# Patient Record
Sex: Male | Born: 1945 | Race: White | Hispanic: No | Marital: Married | Smoking: Former smoker
Health system: Southern US, Community
[De-identification: ages and names within clinical notes are randomized; demographics above are authoritative.]

## PROBLEM LIST (undated history)

## (undated) DIAGNOSIS — I219 Acute myocardial infarction, unspecified: Secondary | ICD-10-CM

## (undated) DIAGNOSIS — I10 Essential (primary) hypertension: Secondary | ICD-10-CM

## (undated) DIAGNOSIS — I2510 Atherosclerotic heart disease of native coronary artery without angina pectoris: Secondary | ICD-10-CM

## (undated) DIAGNOSIS — K219 Gastro-esophageal reflux disease without esophagitis: Secondary | ICD-10-CM

## (undated) DIAGNOSIS — I714 Abdominal aortic aneurysm, without rupture: Secondary | ICD-10-CM

## (undated) DIAGNOSIS — E785 Hyperlipidemia, unspecified: Secondary | ICD-10-CM

## (undated) DIAGNOSIS — Z9889 Other specified postprocedural states: Secondary | ICD-10-CM

## (undated) DIAGNOSIS — N529 Male erectile dysfunction, unspecified: Secondary | ICD-10-CM

## (undated) DIAGNOSIS — I471 Supraventricular tachycardia, unspecified: Secondary | ICD-10-CM

## (undated) DIAGNOSIS — Z860100 Personal history of colon polyps, unspecified: Secondary | ICD-10-CM

## (undated) DIAGNOSIS — J439 Emphysema, unspecified: Secondary | ICD-10-CM

## (undated) DIAGNOSIS — E669 Obesity, unspecified: Secondary | ICD-10-CM

## (undated) DIAGNOSIS — R112 Nausea with vomiting, unspecified: Secondary | ICD-10-CM

## (undated) HISTORY — DX: Supraventricular tachycardia: I47.1

## (undated) HISTORY — DX: Hyperlipidemia, unspecified: E78.5

## (undated) HISTORY — DX: Gastro-esophageal reflux disease without esophagitis: K21.9

## (undated) HISTORY — DX: Atherosclerotic heart disease of native coronary artery without angina pectoris: I25.10

## (undated) HISTORY — DX: Male erectile dysfunction, unspecified: N52.9

## (undated) HISTORY — PX: COLONOSCOPY: SHX174

## (undated) HISTORY — DX: Obesity, unspecified: E66.9

## (undated) HISTORY — DX: Abdominal aortic aneurysm, without rupture: I71.4

## (undated) HISTORY — DX: Supraventricular tachycardia, unspecified: I47.10

## (undated) HISTORY — DX: Acute myocardial infarction, unspecified: I21.9

## (undated) HISTORY — PX: ERCP: SHX60

## (undated) HISTORY — DX: Essential (primary) hypertension: I10

---

## 2000-06-28 DIAGNOSIS — I219 Acute myocardial infarction, unspecified: Secondary | ICD-10-CM

## 2000-06-28 HISTORY — DX: Acute myocardial infarction, unspecified: I21.9

## 2001-02-26 HISTORY — PX: CORONARY ARTERY BYPASS GRAFT: SHX141

## 2001-03-22 ENCOUNTER — Encounter: Payer: Self-pay | Admitting: Cardiovascular Disease

## 2001-03-22 ENCOUNTER — Encounter: Payer: Self-pay | Admitting: Thoracic Surgery (Cardiothoracic Vascular Surgery)

## 2001-03-22 ENCOUNTER — Inpatient Hospital Stay (HOSPITAL_COMMUNITY): Admission: EM | Admit: 2001-03-22 | Discharge: 2001-03-27 | Payer: Self-pay | Admitting: Emergency Medicine

## 2001-03-22 HISTORY — PX: CARDIAC CATHETERIZATION: SHX172

## 2001-03-23 ENCOUNTER — Encounter: Payer: Self-pay | Admitting: Thoracic Surgery (Cardiothoracic Vascular Surgery)

## 2001-03-24 ENCOUNTER — Encounter: Payer: Self-pay | Admitting: Thoracic Surgery (Cardiothoracic Vascular Surgery)

## 2001-03-25 ENCOUNTER — Encounter: Payer: Self-pay | Admitting: Thoracic Surgery (Cardiothoracic Vascular Surgery)

## 2001-04-23 ENCOUNTER — Emergency Department (HOSPITAL_COMMUNITY): Admission: EM | Admit: 2001-04-23 | Discharge: 2001-04-23 | Payer: Self-pay | Admitting: Emergency Medicine

## 2005-06-28 DIAGNOSIS — I714 Abdominal aortic aneurysm, without rupture, unspecified: Secondary | ICD-10-CM

## 2005-06-28 HISTORY — DX: Abdominal aortic aneurysm, without rupture: I71.4

## 2005-06-28 HISTORY — DX: Abdominal aortic aneurysm, without rupture, unspecified: I71.40

## 2005-10-18 ENCOUNTER — Emergency Department (HOSPITAL_COMMUNITY): Admission: EM | Admit: 2005-10-18 | Discharge: 2005-10-18 | Payer: Self-pay | Admitting: Emergency Medicine

## 2005-10-19 ENCOUNTER — Ambulatory Visit (HOSPITAL_COMMUNITY): Admission: RE | Admit: 2005-10-19 | Discharge: 2005-10-19 | Payer: Self-pay | Admitting: Gastroenterology

## 2005-10-20 ENCOUNTER — Observation Stay (HOSPITAL_COMMUNITY): Admission: EM | Admit: 2005-10-20 | Discharge: 2005-10-21 | Payer: Self-pay | Admitting: Emergency Medicine

## 2005-10-26 HISTORY — PX: US ECHOCARDIOGRAPHY: HXRAD669

## 2007-01-31 HISTORY — PX: CARDIOVASCULAR STRESS TEST: SHX262

## 2007-04-26 ENCOUNTER — Ambulatory Visit: Payer: Self-pay | Admitting: Family Medicine

## 2007-06-05 ENCOUNTER — Ambulatory Visit: Payer: Self-pay | Admitting: Family Medicine

## 2007-07-03 LAB — HM COLONOSCOPY

## 2008-10-11 ENCOUNTER — Ambulatory Visit: Payer: Self-pay | Admitting: Family Medicine

## 2008-11-11 ENCOUNTER — Emergency Department (HOSPITAL_COMMUNITY): Admission: EM | Admit: 2008-11-11 | Discharge: 2008-11-11 | Payer: Self-pay | Admitting: Emergency Medicine

## 2008-11-13 ENCOUNTER — Ambulatory Visit: Payer: Self-pay | Admitting: Family Medicine

## 2009-02-14 ENCOUNTER — Ambulatory Visit: Payer: Self-pay | Admitting: Family Medicine

## 2009-05-16 ENCOUNTER — Encounter: Admission: RE | Admit: 2009-05-16 | Discharge: 2009-05-16 | Payer: Self-pay | Admitting: Family Medicine

## 2010-02-13 ENCOUNTER — Ambulatory Visit: Payer: Self-pay | Admitting: Cardiovascular Disease

## 2010-03-31 ENCOUNTER — Ambulatory Visit: Payer: Self-pay | Admitting: Family Medicine

## 2010-05-01 ENCOUNTER — Ambulatory Visit: Payer: Self-pay | Admitting: Family Medicine

## 2010-10-06 LAB — CBC
Hemoglobin: 16.8 g/dL (ref 13.0–17.0)
MCV: 96 fL (ref 78.0–100.0)
Platelets: 150 10*3/uL (ref 150–400)
RBC: 5.17 MIL/uL (ref 4.22–5.81)

## 2010-10-06 LAB — URINALYSIS, ROUTINE W REFLEX MICROSCOPIC
Glucose, UA: NEGATIVE mg/dL
Protein, ur: NEGATIVE mg/dL
Specific Gravity, Urine: 1.026 (ref 1.005–1.030)
Urobilinogen, UA: 0.2 mg/dL (ref 0.0–1.0)

## 2010-10-06 LAB — DIFFERENTIAL
Basophils Relative: 0 % (ref 0–1)
Eosinophils Relative: 2 % (ref 0–5)
Monocytes Absolute: 0.7 10*3/uL (ref 0.1–1.0)

## 2010-10-06 LAB — COMPREHENSIVE METABOLIC PANEL
AST: 31 U/L (ref 0–37)
Albumin: 3.9 g/dL (ref 3.5–5.2)
Alkaline Phosphatase: 95 U/L (ref 39–117)
CO2: 21 mEq/L (ref 19–32)
Creatinine, Ser: 1.02 mg/dL (ref 0.4–1.5)
GFR calc Af Amer: >60 mL/min (ref >60)
GFR calc non Af Amer: >60 mL/min (ref >60)
Potassium: 4.4 mEq/L (ref 3.5–5.1)

## 2010-11-13 NOTE — Discharge Summary (Signed)
[place]  Patient:    Martin Dickerson, Martin Dickerson Visit Number: [id number] MRN: [id number]          Service Type: MED Location: 2000 2029 01 Attending Physician:  Tressie Stalker Dictated by:   Tollie Pizza. Collins, P.A.-C. Admit Date:  03/22/2001 Disc. Date: 03/27/01   CC:         Alvia Grove., M.D.   Discharge Summary  PRIMARY ADMITTING DIAGNOSES: 1. Acute myocardial infarction. 2. Three-vessel coronary artery disease.  ADDITIONAL DIAGNOSES: 1. History of tobacco abuse. 2. Newly diagnosed hyperlipidemia.  PROCEDURES PERFORMED: 1. Urgent cardiac catheterization. 2. Coronary artery bypass grafting x 4 (left internal mammary artery to the    LAD, saphenous vein graft to second diagonal, right internal mammary artery    to the distal right coronary artery, saphenous vein graft to first    circumflex marginal).  HISTORY:  The patient is a 65 year old white male with no known previous cardiac history.  He does have a significant history of tobacco abuse.  On the date of this admission, he developed sudden onset of severe substernal chest pain.  He was brought via EMS to the emergency department, where he was diagnosed with an acute evolving myocardial infarction.  He was started on IV heparin and nitroglycerin and was stabilized.  He was taken urgently to the cardiac catheterization lab by Dr. Elease Hashimoto and underwent cardiac catheterization, which showed severe three-vessel coronary artery disease and mild left ventricular dysfunction.  It was felt that he would be a good candidate for surgical revascularization.  He was seen in consultation by Dr. Cornelius Moras and was taken to the operating room on September 26.  He underwent a CABG x 4 with the above-noted grafts.  He tolerated the procedure well and was transferred to the SICU in stable condition.  Postoperatively, he has done well.  He was extubated shortly after surgery.  Postop day #1, he  was hemodynamically stable, chest tubes were removed, and he was transferred to 2000.  He has been ambulating in the halls without difficulty.  He has been weaned off supplemental oxygen and his O2 saturations are 91% on room air. His incisions are healing well.  He is tolerating a regular diet and having normal bowel and bladder function.  His labs show a hemoglobin of 9.1 and a hematocrit of 27.  If is felt that if he continues stable, he may be ready for discharge home on March 27, 2001.  DISCHARGE MEDICATIONS: 1. Enteric-coated aspirin 325 mg p.o. q.d. 2. Lopressor 25 mg b.i.d. 3. Altace 2.5 mg q.d. 4. Tylox 1-2 q.4h. p.r.n. for pain.  DISCHARGE INSTRUCTIONS:  He is asked to refrain from driving or lifting anything heavier than 10 pounds.  He should continue a low-fat/low-sodium diet.  He is asked to shower daily and clean his incisions with soap and water.  DISCHARGE FOLLOW-UP:  He will see Dr. Elease Hashimoto in the office in two weeks and have a chest x-ray performed at that time.  He will then follow up the following week with Dr. Cornelius Moras and should bring his chest x-ray to this appointment for Dr. Barry Dienes review.  He is asked to call our office if he experiences any difficulty in the interim. Dictated by:   Tollie Pizza Collins, P.A.-C. Attending Physician:  Tressie Stalker DD:  03/26/01 TD:  03/26/01 Job: [id number]

## 2010-11-13 NOTE — Consult Note (Signed)
NAMERUDOLPHO, CLAXTON NO.:  [id number]   MEDICAL RECORD NO.:  [id number]          PATIENT TYPE:  EMS   LOCATION:  MAJO                         FACILITY:  [facility]   PHYSICIAN:  Janetta Hora. Fields, MD  DATE OF BIRTH:  [birth year]   DATE OF CONSULTATION:  10/18/2005  DATE OF DISCHARGE:                                   CONSULTATION   REQUESTING PHYSICIAN:  Hassan Buckler. Caporossi, MD   REASON FOR CONSULTATION:  Abdominal aortic aneurysm with abdominal pain.   HISTORY OF PRESENT ILLNESS:  The patient is a 65 year old male with a one  day history of abdominal pain in the right upper quadrant.  He also had an  episode of this yesterday, which lasted approximately 45 minutes.  He had  two similar episodes over the past few weeks.  He denies any nausea,  vomiting, diarrhea, fevers or chills.  He denies jaundice.  He denies recent  stool color change.  He denies back pain.  Ultrasound earlier today showed a  5.2 cm abdominal aortic aneurysm with no gallstones or gallbladder wall  thickening; however, a CT angio shows that he has a 3.9 cm abdominal aortic  aneurysm with a common bile duct stone and ductal dilatation.  There is no  evidence of rupture of the aneurysm.   PAST MEDICAL HISTORY:  Coronary artery disease, myocardial infarction,  increased lipids.   PAST SURGICAL HISTORY:  Coronary artery bypass grafting by Dr. Cornelius Moras in 2002.   SOCIAL HISTORY:  Former smoker, quit in 2002.  Occasional alcohol use.   REVIEW OF SYSTEMS:  Occasional dyspnea on exertion.  Denies chest pain,  diabetes, TIA, or musculoskeletal pain.   PHYSICAL EXAMINATION:  VITAL SIGNS:  Blood pressure 180/110, pulse 88,  respirations 16, temperature 98.5.  GENERAL:  He is a white male in no acute distress.  HEENT:  Anicteric sclerae.  NECK:  Carotid pulses 2+ without bruit.  CHEST:  Clear to auscultation.  CARDIAC:  Regular rate and rhythm.  :  ABDOMEN:  Obese, soft, nontender, nondistended.  No  peritoneal signs.  EXTREMITIES:  Femoral and popliteal pulses 2+.  NEUROLOGIC:  He is alert and oriented x3.   LABORATORY:  Hemoglobin 15.9, white blood cell count 8.9, platelet count  144.  Lipase 43, bilirubin 2.5, alkaline phosphatase 114, AST 512, ALT 303.  Creatinine is 1.2.   ASSESSMENT:  Patient with a 3.9 cm abdominal aortic aneurysm without  evidence of rupture.  He also has a common bile duct stone with increased  LFTs and ductal dilatation.   RECOMMENDATIONS:  1.  General surgery consult.  2.  Follow up ultrasound at an appointment with me in one year for repeat      ultrasound of his abdominal aortic aneurysm.  Our office will call to      schedule the patient.           ______________________________  Janetta Hora. Fields, MD     CEF/MEDQ  D:  10/18/2005  T:  10/18/2005  Job:  [id number]

## 2010-11-13 NOTE — Op Note (Signed)
Martin Dickerson, LITKE NO.:  [id number]   MEDICAL RECORD NO.:  [id number]          PATIENT TYPE:  AMB   LOCATION:  ENDO                         FACILITY:  [facility]   PHYSICIAN:  Petra Kuba, M.D.    DATE OF BIRTH:  June 20, 1946   DATE OF PROCEDURE:  10/19/2005  DATE OF DISCHARGE:                                 OPERATIVE REPORT   PROCEDURE:  ERCP sphincterotomy, stone extraction.   INDICATIONS:  CBD stone, both clinically and on CT scan.  Consent was signed  after risks, benefits, methods, options thoroughly discussed in the office  and they saw our ERCP video, both he and his wife.   MEDICINES USED:  Fentanyl 100 mcg, Versed 8 mg.   PROCEDURE:  The side-viewing therapeutic video duodenoscope was inserted by  indirect vision into the stomach.  He had lots of dark fluid which was  easily suctioned. Prior to putting the scope, he did vomit a few times  similar material.  Once we suctioned majority of the fluid lots of gastric  shallow ulcerations were seen including of the greater curve, lesser curve,  antrum and pylorus.  Scope was inserted into the bulb which was scarred and  had some ulcers.  We were able to advance into the second portion of the  duodenum.  Again multiple shallow ulcers and erosions were seen.  The  ampulla which was normal appearing was brought into view.  There was a small  periampullary diverticulum.  Using the triple-lumen sphincterotome, we went  ahead and easily cannulated the papilla on the first injection, a minimal PD  was seen.  The injection was stopped the second that we realized that it was  the PD.  The catheter was readjusted and we were able to get selective  cannulation of the CBD and using the Jag wire, deep selective cannulation  was obtained.  An obvious moderate size stone in the distal duct was  confirmed.  No other abnormalities were seen in the CBD.  At the end of the  procedure using the balloon, we were able to fill the  cystic duct and some  of the gallbladder, but we did not fill it until the end of the procedure.  We only minimally filled the intrahepatic but they were grossly normal on  quick evaluation. We went ahead and proceeded with a moderate-sized  sphincterotomy until we had excellent biliary drainage and able to get the  fully bowed sphincterotome in and out of the duct.  We then exchanged the  sphincterotome for the adjustable balloon.  The stone was not delivered on  the first pull-through which had be deflated to withdraw through but in  readvancing the balloon and inflating only to the 9 mm mark, we had  initially inflated to the 12.  The 9 mm balloon was able to deliver the  stone.  With that there was a little bit of oozing from the sphincterotomy  site.  We went ahead and proceeded with two more balloon pull throughs which  were normal and went much more readily and  easily.  We then proceeded with  an occlusion cholangiogram which did not reveal any additional abnormalities  in this maneuver did reveal the cystic duct being patent and with some  gallbladder filling.  No stones were seen in the gallbladder.  The balloon  was withdrawn one more time without difficulty.  The procedure was  terminated at this junction.  We washed and watched the sphincterotomy site.  The oozing seemed to still be present and we went ahead and advanced the epi  needle.  It seemed to stop at this junction so instead of injecting we just  washed with approximately 4 mL of 1:10,000 of epi into the sphincterotomy  site.  There was no further bleeding.  We did wash and watched the  sphincterotomy site a little longer and could not make it bleed.  There was  some sluggish but present drainage.  We did raise the head of his bed to  confirm some sluggish drainage probably due to spasm or edema.  We elected  to stop the procedure at this junction.  Water and contrast and residual  dark debris was suctioned.  The scope  was withdrawn.  The patient tolerated  the procedure well.  There was no obvious immediate complication.   ENDOSCOPIC DIAGNOSES:  1.  Multiple gastric bulb and duodenal shallow ulcers.  2.  Suctioned a fair amount of fluid from the stomach, both at the beginning      and the end of the procedure.  3.  Periampullary diverticula.  4.  One minimal pancreatic duct injection.  5.  Slightly dilated common bile duct with one single moderately sized stone      and a patent cystic duct and normal intrahepatics which were not      overfilled.  6.  Status post moderate size sphincterotomy.  7.  Stone delivered on the second pull-through using the adjustable balloon.  8.  Two negative balloon pull throughs after that.  9.  Negative occlusion cholangiogram.  10. Sluggish but positive drainage at the end of the procedure.  11. Some oozing from the sphincterotomy which stopped spontaneously with      washing, status post washing with epinephrine only.   PLAN:  Will have to hold aspirin possibly until we rescope him and document  healing.  Will get Dr. Harvie Bridge opinion of how urgently he needs a him back.  In the meantime b.i.d. pump inhibitors for two weeks until we see him back  and then can drop him to once a day.  The warning signs will be discussed.  Will see him back in two weeks unless he has symptoms to repeat liver tests  and discussed questions like possible repeat endoscopy to document healing,  possibly cholecystectomy versus following clinically, although with the  ultrasound CT scan not showing any stones and the ERCP not obviously showing  any residual stones, possibly can hold off.           ______________________________  Petra Kuba, M.D.     MEM/MEDQ  D:  10/19/2005  T:  10/20/2005  Job:  [id number]   cc:   Sharlot Gowda, M.D.  Fax: [phone]   Vesta Mixer, M.D.  Fax: [phone]

## 2010-11-13 NOTE — Cardiovascular Report (Signed)
[place]  Patient:    Martin Dickerson, Martin Dickerson Visit Number: [id number] MRN: [id number]          Service Type: MED Location: 2000 2029 01 Attending Physician:  Tressie Stalker Proc. Date: 03/22/01 Admit Date:  03/22/2001 Discharge Date: 03/27/2001   CC:         Cath Lab   Cardiac Catheterization  INDICATIONS:  Mr. Strey is a middle-aged gentleman with recent onset of chest pain.  He presents to the hospital for further evaluation.  PROCEDURE:  Left heart catheterization with coronary angiography.  DESCRIPTION OF PROCEDURE:  The right femoral artery was easily cannulated using the modified Seldinger technique.  HEMODYNAMICS:  The LV pressure was 151/22 with an aortic pressure of 151/90.  ANGIOGRAPHY:  The left main coronary artery has a 40 to 50% stenosis at its distal segment.  The flow down the left main appears to be well preserved.  The left anterior descending artery is normal in its proximal segment.  It gives off a large diagonal branch.  Following this diagonal branch, there is a 99% stenosis that appears to be a ruptured plaque.  The remainder of the LAD has minor luminal irregularities, but no critical stenosis.  The first diagonal vessel is a fairly large vessel and in fact is about as large as the LAD.  There is a 50% stenosis followed by a 70% stenosis in the proximal aspect of this vessel.  There are several small septal branches coming off the LAD which are unremarkable.  The left circumflex artery is a moderate size vessel.  It is quite tortuous. There is a 50% proximal stenosis.  The first obtuse marginal artery comes off at a fairly acute angle.  There are minor luminal irregularities.  There is a large second obtuse marginal artery which is occluded proximally and fills slowly via left to left collaterals.  The right coronary artery is a large and dominant vessel.  There is a mid 80% stenosis followed by diffuse 50 to 60%  stenosis.  The distal right coronary artery has mild to moderate irregularities.  The posterior descending artery and the posterolateral segment artery are unremarkable.  The left subclavian artery is unremarkable.  There is a 30 to 40% stenosis in the proximal left vertebral artery.  The left internal mammary artery has brisk flow.  The left ventriculogram was performed in the 30 RAO position.  It reveals akinesis/dyskinesis of the apex.  The contractility of the inferior base and anterior base are fairly well preserved.  The overall ejection fraction is mildly to moderately depressed with an ejection fraction of approximately 40%. There is mitral regurgitation.  COMPLICATIONS:  None.  CONCLUSION:  Severe three vessel coronary artery disease.  It appears that his acute lesion is probably in his left anterior descending artery.  He has significant blockages in the other vessels.  He will probably best be served with coronary artery bypass grafting. Attending Physician:  Tressie Stalker DD:  03/29/01 TD:  03/29/01 Job: [id number]

## 2010-11-13 NOTE — Consult Note (Signed)
. Martin Dickerson  Patient:    Martin Dickerson, Martin Dickerson Visit Number: [id number] MRN: [id number]          Service Type: MED Location: 2300 2399 04 Attending Physician:  Koren Bound Dictated by:   Martin Dickerson, M.D. Proc. Date: 03/22/01 Admit Date:  03/22/2001   CC:         Alvia Grove., M.D.  CVTS Office   Consultation Report  REASON FOR CONSULTATION:  Severe three-vessel coronary artery disease status post acute myocardial infarction.  HISTORY OF PRESENT ILLNESS:  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  He developed sudden onset of severe substernal chest pain approximately noon today.  He was brought via EMS to the emergency room where he was diagnosed with acute evolving myocardial infarction.  His chest pain was relieved with administration of intravenous heparin and nitroglycerin.  He was taken to the cardiac catheterization lab promptly by Dr. Elease Hashimoto. Catheterization was notable for severe three-vessel coronary artery disease with mild left ventricular dysfunction.  Cardiac surgical consultation is requested.  REVIEW OF SYSTEMS:  Notable for the following categories and pertinent data: General: The patient has otherwise been well with no significant problems.  He has noted that he becomes progressively more fatigued over the last two to three months.  Otherwise he has been well.  He reports stable weight with no weight gain, weight loss, or other constitutional symptoms.  Cardiac: Notable for chest pain as described above.  He states that he had one similar episode approximately a week ago which was transient and resolved spontaneously.  He also states that he had a particularly severe episode of similar pain approximately three years ago which resolved uneventfully.  He has not sought medical attention for this previously.   He otherwise denies any exertional chest pain.  He reports only mild exertional shortness of breath.  He did get short of breath today with this episode of chest pain occurring at rest.  He denies any problems with PND, orthopnea, or lower extremity edema.  He denies any history of palpitations, syncope, or near syncopal episodes.  Respiratory: Notable for the absence of any symptoms of productive cough, hemoptysis, or wheezing.  Gastrointestinal: Notable only for occasional indigestion.  The patient denies any problems with frequent heartburn.  He has had no problems with diarrhea, constipation, hematochezia, hematemesis, or melena. Neurologic: Essentially negative.  The patient denies any symptoms of TIA or amaurosis fugax.   Musculoskeletal:  Essentially negative.  He does describe chronic mild fatigue and pain in both hands after he has been working strenuously with his hands doing manual tasks.  GU: Negative.  He denies any urinary frequency, hematuria, or dysuria.  Infectious: Negative.  He denies any recent fevers or chills.  Hematologic: Negative.  He denies any history of bleeding diaphysis, frequent epistaxis, or easy bruising.  Endocrine: Negative. Psychiatric: Negative.  Peripheral vascular: Negative.   He specifically denies any symptoms of claudication.  HEENT:  Notable for poor dentition.  He states he has one slightly loose tooth on the left lower molars posteriorly.  PAST MEDICAL HISTORY:  Entirely unremarkable.  The patient denies any known history of hypertension, hypercholesterolemia, or diabetes.  He has no previous cardiac history.  He has been a longstanding smoker.  He smokes approximately one pack of cigarettes per day and has done so for at least 40 years.  He  has had no previous surgical procedures for any reason.  FAMILY HISTORY:  Notable for absence of early onset coronary artery disease. His father did die at the age of 62 of unclear causes.  His mother  suffered a stroke at age 7.  He had one uncle who underwent coronary artery bypass grafting and another uncle who died of coronary artery disease.  SOCIAL HISTORY:  The patient is married and lives with his wife here in [place].  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  His smoking history is as described above.  He denies a history of any significant alcohol consumption.  MEDICATIONS PRIOR TO ADMISSION:  None.  ALLERGIES:  [name] denies any known drug allergies or sensitivities.  PHYSICAL EXAMINATION:  GENERAL:  A well-appearing white male who appears his stated age in no acute distress.  VITAL SIGNS:  He has normal sinus rhythm by telemetry monitor.  Blood pressure approximately 130/70.  He is afebrile.  SKIN:  Clean, dry, and healthy appearing throughout.  HEENT:  Essentially within normal limits although notable for poor dentition. He has no obvious periodontal abscess or other active infections.  NECK:  Supple.  Theree is no cervical or supraclavicular lymphadenopathy. Ther is no jugular venous distension.  No carotid bruits are noted.  CHEST:  Auscultation of the chest reveals clear and symmetrical breath sounds bilaterally.  No wheezes or rhonchi are noted.  CARDIOVASCULAR:  Regular rate and rhythm.  No murmurs, rubs, or gallops are noted.  ABDOMEN:  Flat, soft, and nontender.  There are no palpable masses.  The liver edge is not enlarged.  EXTREMITIES:  Warm and well perfused.  There is no lower extremity edema.  The patient has strong and symmetrical pulses in both lower legs at the ankles. There is no venous insufficiency.  RECTAL/GU:  Exams deferred.  NEUROLOGIC:  Grossly nonfocal and symmetrical throughout.  The remainder of his physical exam is noncontributory.  DIAGNOSTIC TESTS:  Cardiac catheterization performed today by Dr. Elease Hashimoto is  reviewed.  This demonstrates 30 to 50% stenosis of the distal left  main coronary artery.  There is 80 to 90% stenosis of the mid left anterior descending coronary artery arising beyond takeoff of a large second diagonal branch,.  There is 70% proximal stenosis of the second diagonal branch.  There is 100% occlusion of a large circumflex marginal branch.  This fills via left-to-left collaterals.  There is right-dominant coronary circulation. There is 80% stenosis of the mid portion of the right coronary artery.  Left ventricular function is mildly reduced.  Ejection fraction is estimated at 45 to 50%.  There is moderate anteroapical hypokinesis.  There is no mitral regurgitation.  There is 60 to 70% ostial stenosis of the left vertebral artery.  I do not appreciate any significant stenosis of the left subclavian artery, and the left internal mammary artery appears to fill normally.  No other abnormalities are noted.  IMPRESSION:  Severe three-vessel coronary artery disease status post acute non-Q-wave myocardial infarction.  PLAN:  We tentatively plan to proceed with coronary artery bypass grafting first case tomorrow.  I have outlined at length the indications, risks, and potential benefits of coronary artery bypass grafting with Mr. Mitcham and his wife.  All of their questions have been addressed.  They understand and accept all associated risks of surgery including but not limited to risk of death, stroke, myocardial infarction, bleeding requiring blood transfusion, arrhythmia, infection, and recurrent coronary artery disease.  We also discussed the importance that he find a way to quit smoking. Dictated by:   Martin Dickerson, M.D. Attending Physician:  Koren Bound DD:  03/22/01 TD:  03/23/01 Job: [id number]

## 2010-11-13 NOTE — Discharge Summary (Signed)
NAMEJT, Martin Dickerson NO.:  [id number]   MEDICAL RECORD NO.:  [id number]          PATIENT TYPE:  OBV   LOCATION:  4734                         FACILITY:  [facility]   PHYSICIAN:  Vesta Mixer, M.D. DATE OF BIRTH:  1945/07/07   DATE OF ADMISSION:  10/20/2005  DATE OF DISCHARGE:  10/21/2005                                 DISCHARGE SUMMARY   DISCHARGE DIAGNOSES:  1.  Supraventricular tachycardia.  2.  History of coronary artery disease.  3.  Hyperlipidemia.  4.  History of abdominal aortic aneurysm.  5.  History of gallstones, status post endoscopic retrograde      cholangiopancreatogram.   DISCHARGE MEDICATIONS:  1.  Enteric-coated aspirin 81 mg a day.  2.  Vytorin 10 mg/40 mg once a day.  3.  Metoprolol 25 mg p.o. b.i.d.  4.  Zantac once a day.  5.  Nitroglycerin 0.4 mg sublingually as needed.   DISPOSITION:  The patient will see Dr. Elease Hashimoto in the office in one to two  weeks.   HISTORY:  Martin Dickerson is a 65 year old gentleman who was recently admitted for  ERCP for a gallstone.  He tolerated the procedure quite well.  He was off  his Lopressor for the past three days.  He returned last night with tachy  palpitations and some chest discomfort.  Please see dictated H&P for further  details.   HOSPITAL COURSE:  SVT.  The patient's SVT resolved.  The SVT resolved after  20 mg of IV Cardizem and converted to normal sinus rhythm.  The patient's  chest pain and tachy palpitations resolved.  He ruled out for myocardial  infarction.   The patient did well and feels quite well this morning.  We will see him in  the office for an echocardiogram, as well as a stress test.  He has a  history of coronary artery bypass grafting in 2002.  Will continue with his  other medications including his Lopressor.  I have asked him to call me  sooner if he has any problems.           ______________________________  Vesta Mixer, M.D.     PJN/MEDQ  D:  10/21/2005  T:   10/21/2005  Job:  [id number]   cc:   Cassell Clement, M.D.  Fax: [phone]   Janetta Hora. Fields, MD  [address], [place] [address]   Petra Kuba, M.D.  Fax: [phone]

## 2010-11-13 NOTE — H&P (Signed)
Martin Dickerson, SENTELL NO.:  [id number]   MEDICAL RECORD NO.:  [id number]          PATIENT TYPE:  INP   LOCATION:  1824                         FACILITY:  [facility]   PHYSICIAN:  Cassell Clement, M.D. DATE OF BIRTH:  [birth year]   DATE OF ADMISSION:  10/20/2005  DATE OF DISCHARGE:                                HISTORY & PHYSICAL   CHIEF COMPLAINT:  Chest pain.   HISTORY:  This is a 65 year old, married, Caucasian gentleman admitted  through the emergency room after experiencing chest pain and rapid  supraventricular tachycardia at a rate of 210 per minute.  The patient had  been seen here in the emergency room two days ago, on October 18, 2005, with  right upper quadrant pain, was found to have a common duct stone.  He was  also found to have an asymptomatic abdominal aortic aneurysm.  He was seen  by Dr. Fabienne Bruns who felt that the aneurysm could be followed as an  outpatient in his office.  He returned yesterday, October 19, 2005, and  underwent successful ERCP of a common duct stone by Dr. Ewing Schlein.  The patient  was also noted at the time of the endoscopy to have some superficial gastric  erosions and was therefore advised not to resume his aspirin for about two  weeks.  The patient has a history of coronary disease.  He normally has been  on Metoprolol 50 mg a half a tablet b.i.d., Ecotrin 81 mg daily, and Vytorin  10/40 once a day.  Because of his GI symptoms, on October 18, 2005, he did not  take any Metoprolol after his morning dose on October 18, 2005.  He took none  on October 19, 2005 or today.  Today at home, he awoke from a nap with severe  substernal chest pain radiating into both arms associated with diaphoresis  and nausea.  He took a single nitroglycerin with no help and at that point  called EMS.  When they arrived, they found that he was in a rapid narrow  complex supraventricular tachycardia at 210 per minute.  They gave him a  slow bolus of 20 mg of IV  Cardizem and he broke into sinus rhythm and at the  same time his chest pain resolved.   PAST MEDICAL HISTORY:  He underwent cardiac catheterization in 2002, by Dr.  Kristeen Miss, who found severe three-vessel coronary artery disease and he  was referred to Dr. Tressie Stalker who undertook coronary artery bypass graft  surgery.  The patient has done well since his bypass.  He has not had any  followup stress testing, and he has had no angina.   FAMILY HISTORY:  Revealed that his half brother has had coronary disease and  has had stents.  The patient has lots of uncles and cousins with coronary  disease.   SOCIAL HISTORY:  Reveals that he is married.  He has two children by a  previous marriage.  He works as a Higher education careers adviser at [employer].  He quit smoking, in 2002, at  the time of his CABG.  He does not  drink alcohol.   He has no known drug allergies.   He has had no operations except for his coronary bypass surgery.   REVIEW OF SYSTEMS:  The patient has not been experiencing any exertional  chest pain.  He does have a recently discovered abdominal aortic aneurysm,  and he will be followed up by Dr. Fabienne Bruns of [facility].  Gastrointestinal  history reveals that he does not have any history of prior known gallstones  and has had chronic indigestion which has not recurred since he had his  procedure yesterday.  GENITOURINARY:  Reveals no symptoms.  RESPIRATORY:  Reveals no cough or sputum production.  ENDOCRINE:  Reveals no diabetes  history.   PHYSICAL EXAMINATION:  VITAL SIGNS:  Blood pressure 112/62, pulse 95,  respirations normal.  HEENT:  Negative.  CHEST:  Clear.  HEART:  Reveals no murmur, gallop, or rub.  ABDOMEN:  Soft and nontender.  EXTREMITIES:  Show no edema or phlebitis.   His electrocardiogram #1, taken at home, shows SVT at 210 per minute with ST  segment depression in II and in V4-V6.  The second electrocardiogram, taken  here, shows normal  sinus rhythm, minor nonspecific T wave flattening and  left atrial abnormality.  The chest x-ray shows normal heart size,  postoperative changes from his bypass surgery, and minimal bibasilar  atelectasis.   Initial labs on I-STAT include a hemoglobin of 15, white count of 11,000.  Potassium 3.7, sodium 134, BUN 13, creatinine 1.1, blood sugar 99.  His  initial cardiac enzymes are negative.   IMPRESSION:  1.  Supraventricular tachycardia, now resolved.  The supraventricular      tachycardia may have been precipitated by the abrupt stopping of his      beta-blocker.  2.  Chest pain consistent with acute coronary syndrome brought on by marked      tachycardia, now resolved.  3.  Recent endoscopic retrograde cholangiopancreatography yesterday for      common duct stone.  He still has some visual jaundice.  4.  Superficial gastric erosions.  5.  Asymptomatic abdominal aortic aneurysm.  6.  Status post coronary artery bypass graft, 2002.  7.  Hyperlipidemia.   DISPOSITION:  1.  We are admitting to observation status to Dr. Elease Hashimoto on telemetry.  2.  We will get serial enzymes and EKGs.  3.  We will resume his metoprolol now.  4.  We will not restart his aspirin yet in view of his recent gastric      erosions.  5.  If he rules out for myocardial infarction, he may possibly be discharged      tomorrow with followup with Dr. Elease Hashimoto for outpatient evaluation      including possible Cardiolite stress test.  He should also at some point      probably have an echocardiogram in view of his left atrial abnormality      on EKG.           ______________________________  Cassell Clement, M.D.     TB/MEDQ  D:  10/20/2005  T:  10/20/2005  Job:  [id number]   cc:   Vesta Mixer, M.D.  Fax: [phone]   Petra Kuba, M.D.  Fax: [phone]   Janetta Hora. Fields, MD  [address], [place] [address]

## 2010-11-13 NOTE — Op Note (Signed)
[place]  Patient:    Martin Dickerson, Martin Dickerson Visit Number: [id number] MRN: [id number]          Service Type: MED Location: 2300 2311 01 Attending Physician:  Tressie Stalker Dictated by:   Salvatore Decent. Cornelius Moras, M.D. Proc. Date: 03/23/01 Admit Date:  03/22/2001   CC:         Alvia Grove., M.D.   Operative Report  PREOPERATIVE DIAGNOSIS:  Severe three-vessel coronary artery disease, status post acute non-Q-wave myocardial infarction.  POSTOPERATIVE DIAGNOSIS:  Severe three-vessel coronary artery disease, status post acute non-Q-wave myocardial infarction.  PROCEDURES:  Median sternotomy for coronary artery bypass grafting x 4 (left internal mammary artery to distal left anterior descending coronary artery, right internal mammary artery to distal right coronary artery, saphenous vein graft to second diagonal branch, saphenous vein graft to first circumflex marginal branch).  SURGEON:  Salvatore Decent. Cornelius Moras, M.D.  ASSISTANT:  Dominica Severin, P.A.  ANESTHESIA:  General.  BRIEF CLINICAL NOTE:  Patient is a 65 year old white male with no previous cardiac history and long-standing history of tobacco abuse.  He was admitted on March 22, 2001, with an acute non-Q-wave myocardial infarction.  He was taken to the cardiac catheterization lab by Dr. Elease Hashimoto, where coronary arteriography revealed severe three-vessel coronary artery disease with mild left ventricular dysfunction.  A full consultation note has been dictated previously.  The patient and his wife have been counseled at length regarding the indications, risks, and potential benefits of coronary artery bypass grafting.  They desire to proceed with surgery as described.  DESCRIPTION OF PROCEDURE:  The patient is brought to the operating room on the above-mentioned date, and invasive hemodynamic monitoring is established by the anesthesia service under the care and direction of Maren Beach,  M.D. The patient is placed in the supine position on the operating table. Following the induction of general endotracheal anesthesia, the patients chest, abdomen, both lower extremities, and both groins are prepared and draped in a sterile manner.  Intravenous antibiotics are administered.  A median sternotomy incision is performed, and the left internal mammary artery is dissected from the chest wall and prepared for bypass grafting.  The left internal mammary artery is felt to be good-quality conduit.  Subsequently the right internal mammary artery is dissected from the chest wall and also prepared for bypass grafting.  The right internal mammary artery is felt to be good-quality conduit.  Simultaneously saphenous vein is obtained from the patients right lower leg through a longitudinal incision.  The saphenous vein is felt to be good-quality conduit.  The patient is heparinized systemically.  The pericardium is opened.  The ascending aorta is normal in appearance.  The ascending aorta and the right atrium are cannulated for cardiopulmonary bypass.  Adequate heparinization is verified.  Cardiopulmonary bypass is begun, and the surface of the heart is inspected. Distal sites are selected for coronary bypass grafting.  Portions of saphenous vein and the left internal mammary artery and right internal mammary artery are all trimmed to appropriate length.  A temperature probe is placed in the left ventricular septum and a Styrofoam pad is placed to protect the left phrenic nerve from thermal injury.  A cardioplegia catheter is placed in the ascending aorta.  The patient is cooled to 32 degrees systemic temperature.  The aortic crossclamp is applied, and cardioplegia is delivered in antegrade fashion through the aortic root.  Iced saline slush is applied for topical hypothermia.  The initial cardioplegic  arrest and myocardial cooling are felt to be excellent.  Repeat doses of cardioplegia  are administered intermittently throughout the crossclamp portion of the operation both through the aortic root and down subsequently-placed vein grafts to maintain septal temperatures below 15 degrees Centigrade.   The following distal coronary anastomoses are performed:  (1) The first circumflex marginal branch is grafted with a saphenous vein graft in an end-to-side fashion.  This coronary measures 1.7 mm in diameter and is of good quality.  (2) The second diagonal branch off the left anterior descending coronary artery is grafted with a saphenous vein graft in an end-to-side fashion.  This coronary measures 1.7 mm in diameter and is of good quality.  (3) The distal right coronary artery is grafted with the right internal mammary artery in an end-to-side fashion.  This coronary measures 1.8 mm in diameter and is of fair to good quality.  (4) The distal left anterior descending coronary artery is grafted with the left internal mammary artery artery in an end-to-side fashion.  This coronary measures 1.7 mm in diameter and is of good quality.  Both proximal saphenous vein anastomoses are performed directly to the ascending aorta prior to removal of the aortic crossclamp.  The septal temperature is noted to rise rapidly and dramatically upon reperfusion of the internal mammary arteries.  The heart begins to beat spontaneously.  The aortic crossclamp is removed after a total crossclamp time of 62 minutes.  Normal sinus rhythm resumes spontaneously.  All proximal and distal anastomoses are inspected for hemostasis and appropriate graft orientation. The patient is rewarmed to greater than 37 degrees Centigrade temperature. The patient is weaned from cardiopulmonary bypass without difficulty.  The patients rhythm at separation from bypass is normal sinus rhythm.  No inotropic support is required.  Total cardiopulmonary bypass time for the operation is 95 minutes.  The mediastinum and both  left and right pleural spaces are irrigated with  saline solution containing vancomycin.  Meticulous surgical hemostasis is ascertained.  The mediastinum and both left and right pleural spaces are drained with four chest tubes placed in separate stab incisions inferiorly. The median sternotomy is closed in routine fashion.  The right lower extremity incision is closed in multiple layers in routine fashion.  All skin incisions are closed with subcuticular skin closures.  The patient tolerated the procedure well and is transported to the surgical intensive care unit in stable condition.  There are no intraoperative complications.  All sponge, instrument, and needle counts are verified correct at completion of the operation.  No blood products were administered.Dictated by:   Salvatore Decent Cornelius Moras, M.D. Attending Physician:  Tressie Stalker DD:  03/23/01 TD:  03/23/01 Job: [id number]

## 2011-03-04 ENCOUNTER — Other Ambulatory Visit: Payer: Self-pay | Admitting: Cardiovascular Disease

## 2011-03-05 ENCOUNTER — Other Ambulatory Visit: Payer: Self-pay | Admitting: Cardiovascular Disease

## 2011-03-05 DIAGNOSIS — E7800 Pure hypercholesterolemia, unspecified: Secondary | ICD-10-CM

## 2011-03-05 NOTE — Telephone Encounter (Signed)
Needs yearly app, gave 30 pills will refill more once app made.

## 2011-03-19 ENCOUNTER — Encounter: Payer: Self-pay | Admitting: Family Medicine

## 2011-03-22 ENCOUNTER — Encounter: Payer: Self-pay | Admitting: Family Medicine

## 2011-03-22 ENCOUNTER — Ambulatory Visit (INDEPENDENT_AMBULATORY_CARE_PROVIDER_SITE_OTHER): Payer: Medicare HMO | Admitting: Family Medicine

## 2011-03-22 VITALS — BP 124/82 | HR 70 | Ht 72.0 in | Wt 246.0 lb

## 2011-03-22 DIAGNOSIS — I10 Essential (primary) hypertension: Secondary | ICD-10-CM | POA: Insufficient documentation

## 2011-03-22 DIAGNOSIS — I714 Abdominal aortic aneurysm, without rupture, unspecified: Secondary | ICD-10-CM

## 2011-03-22 DIAGNOSIS — Z23 Encounter for immunization: Secondary | ICD-10-CM

## 2011-03-22 DIAGNOSIS — N529 Male erectile dysfunction, unspecified: Secondary | ICD-10-CM

## 2011-03-22 DIAGNOSIS — I2510 Atherosclerotic heart disease of native coronary artery without angina pectoris: Secondary | ICD-10-CM

## 2011-03-22 DIAGNOSIS — E785 Hyperlipidemia, unspecified: Secondary | ICD-10-CM | POA: Insufficient documentation

## 2011-03-22 DIAGNOSIS — K219 Gastro-esophageal reflux disease without esophagitis: Secondary | ICD-10-CM | POA: Insufficient documentation

## 2011-03-22 LAB — LIPID PANEL
HDL: 45 mg/dL (ref >39)
Triglycerides: 144 mg/dL (ref <150)

## 2011-03-22 LAB — CBC WITH DIFFERENTIAL/PLATELET
HCT: 48.5 % (ref 39.0–52.0)
Lymphs Abs: 2.6 10*3/uL (ref 0.7–4.0)
MCHC: 33.8 g/dL (ref 30.0–36.0)
Monocytes Absolute: 0.6 10*3/uL (ref 0.1–1.0)
Monocytes Relative: 8 % (ref 3–12)
Neutrophils Relative %: 51 % (ref 43–77)
Platelets: 180 10*3/uL (ref 150–400)
RBC: 5.07 MIL/uL (ref 4.22–5.81)
RDW: 14.2 % (ref 11.5–15.5)

## 2011-03-22 LAB — COMPREHENSIVE METABOLIC PANEL
Albumin: 4.4 g/dL (ref 3.5–5.2)
CO2: 27 mEq/L (ref 19–32)
Chloride: 101 mEq/L (ref 96–112)
Creat: 1.11 mg/dL (ref 0.50–1.35)
Glucose, Bld: 91 mg/dL (ref 70–99)
Potassium: 4.7 mEq/L (ref 3.5–5.3)
Sodium: 137 mEq/L (ref 135–145)

## 2011-03-22 MED ORDER — NITROGLYCERIN 0.4 MG SL SUBL: 0.4000 mg | SUBLINGUAL_TABLET | SUBLINGUAL | Status: DC | PRN

## 2011-03-22 MED ORDER — TADALAFIL 20 MG PO TABS: 20.0000 mg | ORAL_TABLET | Freq: Every day | ORAL | Status: DC | PRN

## 2011-03-22 MED ORDER — METOPROLOL TARTRATE 25 MG PO TABS: 25.0000 mg | ORAL_TABLET | Freq: Two times a day (BID) | ORAL | Status: DC

## 2011-03-22 MED ORDER — RANITIDINE HCL 150 MG PO TABS: 150.0000 mg | ORAL_TABLET | Freq: Every day | ORAL | Status: DC

## 2011-03-22 MED ORDER — ROSUVASTATIN CALCIUM 20 MG PO TABS: ORAL_TABLET | ORAL | Status: DC

## 2011-03-22 MED ORDER — HYDROCHLOROTHIAZIDE 12.5 MG PO CAPS: 12.5000 mg | ORAL_CAPSULE | Freq: Every day | ORAL | Status: DC

## 2011-03-22 NOTE — Patient Instructions (Addendum)
Do not use the nitroglycerin when you take the Cialis. Take the acid reducer every other day and potentially every third day a stone your symptoms. Continue on your other medications.

## 2011-03-22 NOTE — Progress Notes (Signed)
Subjective:    Patient ID: Martin Dickerson, male    DOB: Nov 07, 1945, 65 y.o.   MRN: [id number]  HPI He is here for complete examination. He has no particular concerns or complaints. He has had no difficulty with chest pain, shortness of breath, DOE. He continues on medications listed in the chart. His reflux is under good control. He still has some erectile dysfunction but has not gotten the Cialis renewed due to cost. His history was reviewed and is in the record   Review of Systems  Constitutional: Negative.   HENT: Negative.   Eyes: Negative.   Respiratory: Negative.   Cardiovascular: Negative.   Gastrointestinal: Negative.   Genitourinary: Negative.   Musculoskeletal: Negative.   Skin: Negative.   Neurological: Negative.   Hematological: Negative.   Psychiatric/Behavioral: Negative.        Objective:   Physical Exam BP 124/82  Pulse 70  Ht 6' (1.829 m)  Wt 246 lb (111.585 kg)  BMI 33.36 kg/m2  General Appearance:    Alert, cooperative, no distress, appears stated age  Head:    Normocephalic, without obvious abnormality, atraumatic  Eyes:    PERRL, conjunctiva/corneas clear, EOM's intact, fundi    benign  Ears:    Normal TM's and external ear canals  Nose:   Nares normal, mucosa normal, no drainage or sinus   tenderness  Throat:   Lips, mucosa, and tongue normal; teeth and gums normal  Neck:   Supple, no lymphadenopathy;  thyroid:  no   enlargement/tenderness/nodules; no carotid   bruit or JVD  Back:    Spine nontender, no curvature, ROM normal, no CVA     tenderness  Lungs:     Clear to auscultation bilaterally without wheezes, rales or     ronchi; respirations unlabored  Chest Wall:    No tenderness or deformity   Heart:    Regular rate and rhythm, S1 and S2 normal, no murmur, rub   or gallop  Breast Exam:    No chest wall tenderness, masses or gynecomastia  Abdomen:     Soft, non-tender, nondistended, normoactive bowel sounds,    no masses, no hepatosplenomegaly    Genitalia:    Normal male external genitalia without lesions.  Testicles without masses.  No inguinal hernias.  Rectal:    Normal sphincter tone, no masses or tenderness; guaiac negative stool.  Prostate smooth, no nodules, not enlarged.  Extremities:   No clubbing, cyanosis or edema  Pulses:   2+ and symmetric all extremities  Skin:   Skin color, texture, turgor normal, no rashes or lesions.scaly slightly red lesion noted on right cheek   Lymph nodes:   Cervical, supraclavicular, and axillary nodes normal  Neurologic:   CNII-XII intact, normal strength, sensation and gait; reflexes 2+ and symmetric throughout          Psych:   Normal mood, affect, hygiene and grooming.    EKG shows no acute changes       Assessment & Plan:   1. AAA (abdominal aortic aneurysm)  US Aorta Initial Medicare Screen, PR ELECTROCARDIOGRAM, COMPLETE, CBC with Differential, Comprehensive metabolic panel, Lipid Profile, Flu vaccine greater than or equal to 3yo with preservative IM, POCT Occult Blood Stool  2. ASHD (arteriosclerotic heart disease)    3. Hypertension    4. Hyperlipidemia LDL goal <70    5. ED (erectile dysfunction)    6. GERD (gastroesophageal reflux disease)    7. Actinic keratosis: Discussed the therapy for this. He  will wait before before referral Patient did not want PSA I will renew his medications including Cialis.

## 2011-03-23 ENCOUNTER — Telehealth: Payer: Self-pay

## 2011-03-23 ENCOUNTER — Other Ambulatory Visit: Payer: Self-pay

## 2011-03-23 ENCOUNTER — Ambulatory Visit (HOSPITAL_COMMUNITY)
Admission: RE | Admit: 2011-03-23 | Discharge: 2011-03-23 | Disposition: A | Discharge status: Home / Self Care | Payer: Medicare HMO | Source: Ambulatory Visit | Attending: Family Medicine | Admitting: Family Medicine

## 2011-03-23 DIAGNOSIS — I714 Abdominal aortic aneurysm, without rupture, unspecified: Secondary | ICD-10-CM | POA: Insufficient documentation

## 2011-03-23 MED ORDER — METOPROLOL TARTRATE 25 MG PO TABS: 25.0000 mg | ORAL_TABLET | Freq: Two times a day (BID) | ORAL | Status: DC

## 2011-03-23 NOTE — Telephone Encounter (Signed)
Called pt to let him know that lipids not good to take full dose of crestor pt aware

## 2011-03-23 NOTE — Telephone Encounter (Signed)
The u/s needs to repeated in 6 to 12 mth

## 2011-03-24 ENCOUNTER — Telehealth: Payer: Self-pay | Admitting: Family Medicine

## 2011-03-24 MED ORDER — NITROGLYCERIN 0.4 MG SL SUBL: 0.4000 mg | SUBLINGUAL_TABLET | SUBLINGUAL | Status: DC | PRN

## 2011-03-24 MED ORDER — ROSUVASTATIN CALCIUM 20 MG PO TABS: ORAL_TABLET | ORAL | Status: DC

## 2011-03-24 MED ORDER — RANITIDINE HCL 150 MG PO TABS: 150.0000 mg | ORAL_TABLET | Freq: Every day | ORAL | Status: DC

## 2011-03-24 NOTE — Telephone Encounter (Signed)
Done

## 2011-04-22 ENCOUNTER — Encounter: Payer: Self-pay | Admitting: Cardiovascular Disease

## 2011-04-26 ENCOUNTER — Telehealth: Payer: Self-pay | Admitting: *Deleted

## 2011-04-26 NOTE — Telephone Encounter (Signed)
Wife called to cancel Wed. appt with Dr. Elease Hashimoto and lab.  Pt will call back at a later time to reschedule. Mylo Red RN

## 2011-04-28 ENCOUNTER — Ambulatory Visit: Payer: Self-pay | Admitting: Cardiovascular Disease

## 2011-04-28 ENCOUNTER — Other Ambulatory Visit: Payer: Self-pay | Admitting: *Deleted

## 2011-05-14 ENCOUNTER — Telehealth: Payer: Self-pay | Admitting: Family Medicine

## 2011-05-14 MED ORDER — ATORVASTATIN CALCIUM 20 MG PO TABS: 20.0000 mg | ORAL_TABLET | Freq: Every day | ORAL | Status: DC

## 2011-05-14 NOTE — Telephone Encounter (Signed)
Check with the patient and find out if he questioned switching from his Crestor to Lipitor or whether the pharmacy did this and let me know

## 2011-05-14 NOTE — Telephone Encounter (Signed)
Cancelled crestor and added lipitor with 1 refill

## 2011-05-14 NOTE — Telephone Encounter (Signed)
LABS MAILED

## 2011-05-14 NOTE — Telephone Encounter (Signed)
Pharmacy called pt yesterday and asked if he wanted to switch because it was much cheaper and he said yes, so he they wanted to know if it was okay to switch him

## 2011-05-14 NOTE — Telephone Encounter (Signed)
Fax req recd for Atorvastatin #90 in lieu of Crestor.

## 2011-05-14 NOTE — Telephone Encounter (Signed)
Switch him to the 20 mg Lipitor

## 2011-10-05 ENCOUNTER — Telehealth: Payer: Self-pay | Admitting: Internal Medicine

## 2011-10-05 MED ORDER — HYDROCHLOROTHIAZIDE 12.5 MG PO CAPS: 12.5000 mg | ORAL_CAPSULE | Freq: Every day | ORAL | Status: DC

## 2011-10-05 MED ORDER — METOPROLOL TARTRATE 25 MG PO TABS: 25.0000 mg | ORAL_TABLET | Freq: Two times a day (BID) | ORAL | Status: DC

## 2011-10-05 NOTE — Telephone Encounter (Signed)
Med sent in pt needs refill

## 2011-10-05 NOTE — Telephone Encounter (Signed)
Sent 90 days of med in pt needs office visit

## 2011-10-11 ENCOUNTER — Other Ambulatory Visit: Payer: Self-pay

## 2011-10-21 ENCOUNTER — Other Ambulatory Visit: Payer: Self-pay | Admitting: Family Medicine

## 2011-10-21 MED ORDER — METOPROLOL TARTRATE 25 MG PO TABS: 25.0000 mg | ORAL_TABLET | Freq: Two times a day (BID) | ORAL | Status: DC

## 2011-10-21 MED ORDER — HYDROCHLOROTHIAZIDE 12.5 MG PO CAPS: 12.5000 mg | ORAL_CAPSULE | Freq: Every day | ORAL | Status: DC

## 2011-10-21 NOTE — Telephone Encounter (Signed)
Not out right now but on last refill

## 2011-12-21 ENCOUNTER — Other Ambulatory Visit: Payer: Self-pay | Admitting: Family Medicine

## 2012-01-17 ENCOUNTER — Other Ambulatory Visit: Payer: Self-pay | Admitting: Family Medicine

## 2012-03-13 ENCOUNTER — Ambulatory Visit (INDEPENDENT_AMBULATORY_CARE_PROVIDER_SITE_OTHER): Payer: Medicare HMO | Admitting: Family Medicine

## 2012-03-13 ENCOUNTER — Encounter: Payer: Self-pay | Admitting: Family Medicine

## 2012-03-13 VITALS — BP 128/80 | HR 68 | Wt 242.0 lb

## 2012-03-13 DIAGNOSIS — Z23 Encounter for immunization: Secondary | ICD-10-CM

## 2012-03-13 DIAGNOSIS — I951 Orthostatic hypotension: Secondary | ICD-10-CM

## 2012-03-13 MED ORDER — INFLUENZA VIRUS VACC SPLIT PF IM SUSP: 0.5000 mL | Freq: Once | INTRAMUSCULAR | Status: DC

## 2012-03-13 NOTE — Progress Notes (Signed)
  Subjective:    Patient ID: Martin Dickerson, male    DOB: [birth year], 66 y.o.   MRN: [id number]  HPI Last Thursday morning when he got out of bed he noted dizziness. He was able to walk to the bathroom. He continued to have dizziness that lasted for several minutes. We went back to bed the dizziness went away. He did note during the rest of the day that when he would get up from sitting that he would be dizzy very slightly. He has not had any more of these episodes. He noted no numbness, tingling, heart rate changes. Had no changes in his medications the   Review of Systems     Objective:   Physical Exam alert and in no distress. EOMI. Other cranial nerves intact. No carotid bruits. Tympanic membranes and canals are normal. Throat is clear. Tonsils are normal. Neck is supple without adenopathy or thyromegaly. Cardiac exam shows a regular sinus rhythm without murmurs or gallops. Lungs are clear to auscultation.        Assessment & Plan:   1. Postural hypotension    encouraged him to be vigilant. Recommend that he go from lying to sitting to standing to walking a little more slowly. If he has further difficulties, he is to return here. Also the geriatric flu shot was given. Benefits and risks were discussed.

## 2012-03-13 NOTE — Patient Instructions (Signed)
Go slowly from lying to sitting to standing to moving and if you do get a little bit dizzy stop,until you get your bearings back

## 2012-04-19 ENCOUNTER — Telehealth: Payer: Self-pay | Admitting: Internal Medicine

## 2012-04-19 MED ORDER — METOPROLOL TARTRATE 25 MG PO TABS: 25.0000 mg | ORAL_TABLET | Freq: Two times a day (BID) | ORAL | Status: DC

## 2012-04-19 MED ORDER — RANITIDINE HCL 150 MG PO TABS: 150.0000 mg | ORAL_TABLET | Freq: Every day | ORAL | Status: DC

## 2012-04-19 NOTE — Telephone Encounter (Signed)
Med sent in.

## 2012-06-13 ENCOUNTER — Other Ambulatory Visit: Payer: Self-pay | Admitting: Family Medicine

## 2012-07-05 ENCOUNTER — Encounter: Payer: Self-pay | Admitting: Internal Medicine

## 2012-07-06 ENCOUNTER — Other Ambulatory Visit: Payer: Self-pay | Admitting: Family Medicine

## 2012-07-17 ENCOUNTER — Ambulatory Visit (INDEPENDENT_AMBULATORY_CARE_PROVIDER_SITE_OTHER): Payer: Medicare HMO | Admitting: Family Medicine

## 2012-07-17 ENCOUNTER — Encounter: Payer: Self-pay | Admitting: Family Medicine

## 2012-07-17 VITALS — BP 120/80 | HR 70 | Ht 72.0 in | Wt 249.0 lb

## 2012-07-17 DIAGNOSIS — L570 Actinic keratosis: Secondary | ICD-10-CM

## 2012-07-17 DIAGNOSIS — N529 Male erectile dysfunction, unspecified: Secondary | ICD-10-CM

## 2012-07-17 DIAGNOSIS — I2510 Atherosclerotic heart disease of native coronary artery without angina pectoris: Secondary | ICD-10-CM

## 2012-07-17 DIAGNOSIS — Z23 Encounter for immunization: Secondary | ICD-10-CM

## 2012-07-17 DIAGNOSIS — K219 Gastro-esophageal reflux disease without esophagitis: Secondary | ICD-10-CM

## 2012-07-17 DIAGNOSIS — I714 Abdominal aortic aneurysm, without rupture, unspecified: Secondary | ICD-10-CM

## 2012-07-17 DIAGNOSIS — I10 Essential (primary) hypertension: Secondary | ICD-10-CM

## 2012-07-17 DIAGNOSIS — E785 Hyperlipidemia, unspecified: Secondary | ICD-10-CM

## 2012-07-17 DIAGNOSIS — Z0000 Encounter for general adult medical examination without abnormal findings: Secondary | ICD-10-CM

## 2012-07-17 LAB — COMPREHENSIVE METABOLIC PANEL
AST: 18 U/L (ref 0–37)
Albumin: 4.1 g/dL (ref 3.5–5.2)
BUN: 15 mg/dL (ref 6–23)
CO2: 26 mEq/L (ref 19–32)
Calcium: 9.6 mg/dL (ref 8.4–10.5)
Chloride: 101 mEq/L (ref 96–112)
Creat: 1.01 mg/dL (ref 0.50–1.35)
Glucose, Bld: 85 mg/dL (ref 70–99)

## 2012-07-17 LAB — CBC WITH DIFFERENTIAL/PLATELET
Basophils Absolute: 0 10*3/uL (ref 0.0–0.1)
Eosinophils Relative: 3 % (ref 0–5)
Lymphocytes Relative: 33 % (ref 12–46)
MCV: 91.2 fL (ref 78.0–100.0)
Monocytes Absolute: 0.7 10*3/uL (ref 0.1–1.0)
Monocytes Relative: 9 % (ref 3–12)
RBC: 5.14 MIL/uL (ref 4.22–5.81)
RDW: 14.4 % (ref 11.5–15.5)

## 2012-07-17 LAB — LIPID PANEL
Cholesterol: 154 mg/dL (ref 0–200)
LDL Cholesterol: 80 mg/dL (ref 0–99)
Total CHOL/HDL Ratio: 3.5 Ratio
VLDL: 30 mg/dL (ref 0–40)

## 2012-07-17 LAB — HEMOCCULT GUIAC POC 1CARD (OFFICE)

## 2012-07-17 NOTE — Progress Notes (Signed)
Subjective:    Patient ID: Martin Dickerson, male    DOB: 08-22-1945, 67 y.o.   MRN: [id number]  HPI He is here for complete examination. He has no particular concerns or complaints. He has had no chest pain, shortness of breath, DOE or PND. His immunizations are up to date. He last saw his cardiologist in 2011. He had an EKG in 2012. He does have a history of AAA and has had no abdominal pain. He continues on medications listed in the chart. His reflux symptoms are under good control. He does have a history of ED but is not complaining of any problems. He does have some skin lesions but has no real concerns about them.   Review of Systems Negative except as above    Objective:   Physical Exam BP 120/80  Pulse 70  Ht 6' (1.829 m)  Wt 249 lb (112.946 kg)  BMI 33.77 kg/m2  SpO2 98%  General Appearance:    Alert, cooperative, no distress, appears stated age  Head:    Normocephalic, without obvious abnormality, atraumatic  Eyes:    PERRL, conjunctiva/corneas clear, EOM's intact, fundi    benign  Ears:    Normal TM's and external ear canals  Nose:   Nares normal, mucosa normal, no drainage or sinus   tenderness  Throat:   Lips, mucosa, and tongue normal; teeth and gums normal  Neck:   Supple, no lymphadenopathy;  thyroid:  no   enlargement/tenderness/nodules; no carotid   bruit or JVD  Back:    Spine nontender, no curvature, ROM normal, no CVA     tenderness  Lungs:     Clear to auscultation bilaterally without wheezes, rales or     ronchi; respirations unlabored  Chest Wall:    No tenderness or deformity   Heart:    Regular rate and rhythm, S1 and S2 normal, no murmur, rub   or gallop  Breast Exam:    No chest wall tenderness, masses or gynecomastia  Abdomen:     Soft, non-tender, nondistended, normoactive bowel sounds,    no masses, no hepatosplenomegaly  Genitalia:    Normal male external genitalia without lesions.  Testicles without masses.  No inguinal hernias.  Rectal:    Normal  sphincter tone, no masses or tenderness; guaiac negative stool.  Prostate smooth, no nodules, not enlarged.  Extremities:   No clubbing, cyanosis or edema  Pulses:   2+ and symmetric all extremities  Skin:   Skin color, texture, turgor normal, no rashes or lesions  Lymph nodes:   Cervical, supraclavicular, and axillary nodes normal  Neurologic:   CNII-XII intact, normal strength, sensation and gait; reflexes 2+ and symmetric throughout          Psych:   Normal mood, affect, hygiene and grooming.    EKG shows no acute changes       Assessment & Plan:   1. Routine general medical examination at a health care facility  Hemoccult - 1 Card (office)  2. AAA (abdominal aortic aneurysm)  US Aorta  3. ASHD (arteriosclerotic heart disease)  Lipid panel, CBC with Differential, Comprehensive metabolic panel, EKG 12-Lead  4. Hypertension  POCT Urinalysis Dipstick, CBC with Differential, Comprehensive metabolic panel  5. Hyperlipidemia LDL goal <70  Lipid panel  6. ED (erectile dysfunction)    7. GERD (gastroesophageal reflux disease)    8. Actinic keratosis of multiple sites of head and neck     I discussed followup with cardiology and at  this point he is not interested in pursuing this. We also discussed treatment of the actinic keratosis and again at this point he wants to wait. He will continue on his present medication regimen.

## 2012-07-18 ENCOUNTER — Ambulatory Visit (HOSPITAL_COMMUNITY)
Admission: RE | Admit: 2012-07-18 | Discharge: 2012-07-18 | Disposition: A | Discharge status: Home / Self Care | Payer: Medicare HMO | Source: Ambulatory Visit | Attending: Family Medicine | Admitting: Family Medicine

## 2012-07-18 DIAGNOSIS — I714 Abdominal aortic aneurysm, without rupture, unspecified: Secondary | ICD-10-CM

## 2012-07-20 NOTE — Progress Notes (Signed)
Quick Note:  Let him know that the blood work looks okay but the aneurysm does look like it has grown. Refer him to vascular surgery for followup on this. ______

## 2012-07-21 NOTE — Progress Notes (Signed)
Quick Note:  PT INFORMED OF LABS AND ANEURYSM HAVE REFERRED PT TO [facility] [phone] ______

## 2012-07-24 NOTE — Addendum Note (Signed)
Addended by: Shaune Spittle D on: 07/24/2012 11:27 AM   Modules accepted: Level of Service

## 2012-07-25 ENCOUNTER — Other Ambulatory Visit: Payer: Self-pay | Admitting: *Deleted

## 2012-07-31 ENCOUNTER — Encounter: Payer: Self-pay | Admitting: Vascular Surgery

## 2012-08-01 ENCOUNTER — Ambulatory Visit (INDEPENDENT_AMBULATORY_CARE_PROVIDER_SITE_OTHER): Payer: Medicare HMO | Admitting: Vascular Surgery

## 2012-08-01 ENCOUNTER — Encounter: Payer: Self-pay | Admitting: Vascular Surgery

## 2012-08-01 VITALS — BP 134/87 | HR 70 | Resp 20 | Ht 72.0 in | Wt 247.0 lb

## 2012-08-01 DIAGNOSIS — Z01810 Encounter for preprocedural cardiovascular examination: Secondary | ICD-10-CM

## 2012-08-01 DIAGNOSIS — I714 Abdominal aortic aneurysm, without rupture: Secondary | ICD-10-CM

## 2012-08-01 NOTE — Progress Notes (Signed)
[facility] [place]   Patient name: Martin Dickerson MRN: [id number] DOB: Jan 18, 1946 Sex: male   Referred by: Susann Givens  Reason for referral:  Chief Complaint  Patient presents with  . AAA    NEW AAA EVALUATION REFERRED BY DR Susann Givens    HISTORY OF PRESENT ILLNESS: Patient presents today for discussion of abdominal aortic aneurysm. This was found several years ago during evaluation for what sounds like a common bile duct stone. This was treated with ERCP. This is been followed with serial ultrasounds and this is showed continued enlargement. Most recent ultrasound on 07/18/2012 revealed a maximal diameter of 5.3 cm. Of concern is that this appears to a maximal diameter of 4.7 on 03/23/2011. Patient has no symptoms referable to this. He has no family history of aneurysm disease. Does have a significant prior history of coronary disease undergoing coronary bypass grafting in 2002 with no complications following this.  Past Medical History  Diagnosis Date  . ASHD (arteriosclerotic heart disease)   . Dyslipidemia   . GERD (gastroesophageal reflux disease)   . Obesity   . Hemorrhoids   . AAA (abdominal aortic aneurysm) 2007  . ED (erectile dysfunction)   . Hyperlipidemia   . Hypertension   . Myocardial infarction   . SVT (supraventricular tachycardia)     Past Surgical History  Procedure Date  . Ercp   . Coronary artery bypass graft   . Cardiac catheterization 03/22/2001    EF 40%  . US echocardiography 10/26/2005    EF 55-60%  . Cardiovascular stress test 01/31/2007    EF 68% NO EVIDENCE OF ISCHEMIA    History   Social History  . Marital Status: Married    Spouse Name: N/A    Number of Children: N/A  . Years of Education: N/A   Occupational History  . Not on file.   Social History Main Topics  . Smoking status: Former Smoker -- 35 years    Types: Cigarettes    Quit date: 04/21/2005  . Smokeless tobacco: Never Used  . Alcohol Use: No  . Drug Use: No   . Sexually Active: Yes   Other Topics Concern  . Not on file   Social History Narrative  . No narrative on file    Family History  Problem Relation Age of Onset  . Stroke Mother   . Hypertension Father   . Heart disease Maternal Uncle     Allergies as of 08/01/2012 - Review Complete 08/01/2012  Allergen Reaction Noted  . Lipitor (atorvastatin calcium)  03/08/2011    Current Outpatient Prescriptions on File Prior to Visit  Medication Sig Dispense Refill  . aspirin EC 81 MG tablet Take 81 mg by mouth daily.        Marland Kitchen atorvastatin (LIPITOR) 20 MG tablet TAKE 1 TABLET DAILY AT BEDTIME  90 tablet  PRN  . hydrochlorothiazide (MICROZIDE) 12.5 MG capsule TAKE 1 CAPSULE DAILY  90 capsule  PRN  . metoprolol tartrate (LOPRESSOR) 25 MG tablet TAKE 1 TABLET TWICE DAILY  180 tablet  PRN  . nitroGLYCERIN (NITROSTAT) 0.4 MG SL tablet Place 1 tablet (0.4 mg total) under the tongue every 5 (five) minutes as needed.  90 tablet  0  . ranitidine (ZANTAC) 150 MG tablet TAKE 1 TABLET AT BEDTIME  90 tablet  PRN     REVIEW OF SYSTEMS:  Positives indicated with an "X"  CARDIOVASCULAR:  [ ]  chest pain   [ ]  chest pressure   [ ]   palpitations   [ ]  orthopnea   [ ]  dyspnea on exertion   [ ]  claudication   [ ]  rest pain   [ ]  DVT   [ ]  phlebitis PULMONARY:   [ ]  productive cough   [ ]  asthma   [ ]  wheezing NEUROLOGIC:   [ ]  weakness  [ ]  paresthesias  [ ]  aphasia  [ ]  amaurosis  [ ]  dizziness HEMATOLOGIC:   [ ]  bleeding problems   [ ]  clotting disorders MUSCULOSKELETAL:  [ ]  joint pain   [ ]  joint swelling GASTROINTESTINAL: [ ]   blood in stool  [ ]   hematemesis GENITOURINARY:  [ ]   dysuria  [ ]   hematuria PSYCHIATRIC:  [ ]  history of major depression INTEGUMENTARY:  [ ]  rashes  [ ]  ulcers CONSTITUTIONAL:  [ ]  fever   [ ]  chills  PHYSICAL EXAMINATION:  General: The patient is a well-nourished male, in no acute distress. Vital signs are BP 134/87  Pulse 70  Resp 20  Ht 6' (1.829 m)  Wt 247 lb  (112.038 kg)  BMI 33.50 kg/m2 Pulmonary: There is a good air exchange bilaterally without wheezing or rales. Abdomen: Soft and non-tender with normal pitch bowel sounds. Moderate obesity. No surgical incisions. I do not palpate an aneurysm. Musculoskeletal: There are no major deformities.  There is no significant extremity pain. Neurologic: No focal weakness or paresthesias are detected, Skin: There are no ulcer or rashes noted. Psychiatric: The patient has normal affect. Cardiovascular: There is a regular rate and rhythm without significant murmur appreciated. Pulse status: 2+ radial 2+ femoral 2+ popliteal and 2+ dorsalis pedis pulses with no evidence of peripheral artery aneurysm    Impression and Plan:  Enlarging asymptomatic 5.3 cm abdominal aortic aneurysm by ultrasound. I had a long discussion with the patient and his wife present. I explained the natural history and continued enlargement of his aneurysm. I have recommended CT angiogram for further evaluation. I explained the expected outcome with a elective and with emergent repair. I discussed open and stent graft repair. I will see him again in one week with CT angiogram for further evaluation. We will also schedule an appointment for him to see Dr.Nasher for preoperative cardiac evaluation.    Romaine Neville [facility] [place] Office: [phone]

## 2012-08-02 ENCOUNTER — Ambulatory Visit (INDEPENDENT_AMBULATORY_CARE_PROVIDER_SITE_OTHER): Payer: Medicare HMO | Admitting: Cardiovascular Disease

## 2012-08-02 ENCOUNTER — Encounter: Payer: Self-pay | Admitting: Cardiovascular Disease

## 2012-08-02 ENCOUNTER — Ambulatory Visit: Payer: Medicare HMO | Admitting: Cardiovascular Disease

## 2012-08-02 VITALS — BP 150/90 | HR 66 | Ht 72.0 in | Wt 248.8 lb

## 2012-08-02 DIAGNOSIS — I2510 Atherosclerotic heart disease of native coronary artery without angina pectoris: Secondary | ICD-10-CM

## 2012-08-02 DIAGNOSIS — Z01818 Encounter for other preprocedural examination: Secondary | ICD-10-CM

## 2012-08-02 DIAGNOSIS — I10 Essential (primary) hypertension: Secondary | ICD-10-CM

## 2012-08-02 DIAGNOSIS — Z951 Presence of aortocoronary bypass graft: Secondary | ICD-10-CM

## 2012-08-02 MED ORDER — POTASSIUM CHLORIDE CRYS ER 10 MEQ PO TBCR: 10.0000 meq | EXTENDED_RELEASE_TABLET | Freq: Every day | ORAL | Status: DC

## 2012-08-02 MED ORDER — HYDROCHLOROTHIAZIDE 25 MG PO TABS: 25.0000 mg | ORAL_TABLET | Freq: Every day | ORAL | Status: DC

## 2012-08-02 NOTE — Progress Notes (Signed)
Martin Dickerson Date of Birth  11-25-1945       [facility] Office [address]  [address], [address] [place], [place]  [address]   [place], [place]  [address] [phone]     [phone]   Fax  [phone]    Fax [phone]  Problem List: 1. Coronary artery disease-status post coronary artery bypass grafting, 2003 2. Abdominal aortic aneurysm 3. Hypertension 4. Hyperlipidemia 5. Supraventricular tachycardia  History of Present Illness:  [name] is a 67 yo gentleman with a hx of CABG in 2002 or 2003.  He was last seen by me several years ago but we do not have that chart.  He was recently found to have an enlarging abdominal aortic aneurysm (5.3 cm). He has been seen by Dr. Arbie Cookey and needs to have repair of his abdominal aortic aneurysm.  He denies any episodes of chest pain or shortness of breath.  Current Outpatient Prescriptions on File Prior to Visit  Medication Sig Dispense Refill  . aspirin EC 81 MG tablet Take 81 mg by mouth daily.        Marland Kitchen atorvastatin (LIPITOR) 20 MG tablet TAKE 1 TABLET DAILY AT BEDTIME  90 tablet  PRN  . hydrochlorothiazide (MICROZIDE) 12.5 MG capsule TAKE 1 CAPSULE DAILY  90 capsule  PRN  . metoprolol tartrate (LOPRESSOR) 25 MG tablet TAKE 1 TABLET TWICE DAILY  180 tablet  PRN  . nitroGLYCERIN (NITROSTAT) 0.4 MG SL tablet Place 1 tablet (0.4 mg total) under the tongue every 5 (five) minutes as needed.  90 tablet  0  . ranitidine (ZANTAC) 150 MG tablet TAKE 1 TABLET AT BEDTIME  90 tablet  PRN    Allergies  Allergen Reactions  . Lipitor (Atorvastatin Calcium)     MUSCLE ACHES    Past Medical History  Diagnosis Date  . ASHD (arteriosclerotic heart disease)   . Dyslipidemia   . GERD (gastroesophageal reflux disease)   . Obesity   . Hemorrhoids   . AAA (abdominal aortic aneurysm) 2007  . ED (erectile dysfunction)   . Hyperlipidemia   . Hypertension   . Myocardial infarction   . SVT  (supraventricular tachycardia)     Past Surgical History  Procedure Date  . Ercp   . Coronary artery bypass graft   . Cardiac catheterization 03/22/2001    EF 40%  . US echocardiography 10/26/2005    EF 55-60%  . Cardiovascular stress test 01/31/2007    EF 68% NO EVIDENCE OF ISCHEMIA    History  Smoking status  . Former Smoker -- 35 years  . Types: Cigarettes  . Quit date: 04/21/2005  Smokeless tobacco  . Never Used    History  Alcohol Use No    Family History  Problem Relation Age of Onset  . Stroke Mother   . Hypertension Father   . Heart disease Maternal Uncle     Reviw of Systems:  Reviewed in the HPI.  All other systems are negative.  Physical Exam: Blood pressure 150/90, pulse 66, height 6' (1.829 m), weight 248 lb 12.8 oz (112.855 kg), SpO2 98.00%. General: Well developed, well nourished, in no acute distress.  Head: Normocephalic, atraumatic, sclera non-icteric, mucus membranes are moist,   Neck: Supple. Carotids are 2 + without bruits. No JVD   Lungs: Clear   Heart: RR, normal S1, S2  Abdomen: Soft, non-tender, non-distended with normal bowel sounds.  Msk:  Strength and  tone are normal   Extremities: No clubbing or cyanosis. No edema.  Distal pedal pulses are 2+ and equal    Neuro: CN II - XII intact.  Alert and oriented X 3.   Psych:  Normal   ECG: July 17, 2012: Normal sinus rhythm. Has left axis deviation. He has no ST or T wave changes.  Assessment / Plan:

## 2012-08-02 NOTE — Patient Instructions (Addendum)
Your physician has requested that you have a lexiscan myoview/ 2 DAY PROTOCOL. AS SOON AS POSSIBLE PLEASE/ PRE OP AAA,  Please follow instruction sheet, as given.   Your physician recommends that you schedule a follow-up appointment in: 3 MONTHS   Your physician recommends that you return for lab work in: 3 MONTHS/ BMET   Your physician has recommended you make the following change in your medication:   INCREASE HCTZ TO 25 MG DAILY START KDUR/ POTASSIUM CHLORIDE 10 MEQ DAILY

## 2012-08-02 NOTE — Assessment & Plan Note (Signed)
His blood pressure is mildly elevated today. He may have eaten a little bit of extra salt of the past couple days. We will increase his HCTZ to 25 mg a day and we will also add Tessalon chloride 10 mEq a day. I seen again in 3 months for followup office visit and a segment about profile.  We discussed the importance of keeping his blood pressure under good control since he has an abdominal aortic aneurysm.

## 2012-08-02 NOTE — Assessment & Plan Note (Signed)
Martin Dickerson is doing fairly well. He has a history of coronary artery bypass grafting approximately 11 or 12 years ago. He has not had any episodes of angina but is scheduled for major vascular surgery. We will schedule him for a 2 day Lexiscan Myoview study for further evaluation.

## 2012-08-07 ENCOUNTER — Encounter: Payer: Self-pay | Admitting: Vascular Surgery

## 2012-08-07 ENCOUNTER — Encounter: Payer: Self-pay | Admitting: Cardiovascular Disease

## 2012-08-08 ENCOUNTER — Ambulatory Visit
Admission: RE | Admit: 2012-08-08 | Discharge: 2012-08-08 | Disposition: A | Discharge status: Home / Self Care | Payer: Medicare HMO | Source: Ambulatory Visit | Attending: Vascular Surgery | Admitting: Vascular Surgery

## 2012-08-08 ENCOUNTER — Encounter: Payer: Self-pay | Admitting: Vascular Surgery

## 2012-08-08 ENCOUNTER — Ambulatory Visit (INDEPENDENT_AMBULATORY_CARE_PROVIDER_SITE_OTHER): Payer: Medicare HMO | Admitting: Vascular Surgery

## 2012-08-08 VITALS — BP 142/79 | HR 72 | Ht 72.0 in | Wt 247.0 lb

## 2012-08-08 DIAGNOSIS — Z01810 Encounter for preprocedural cardiovascular examination: Secondary | ICD-10-CM

## 2012-08-08 DIAGNOSIS — I714 Abdominal aortic aneurysm, without rupture: Secondary | ICD-10-CM

## 2012-08-08 MED ORDER — IOHEXOL 350 MG/ML SOLN
80.0000 mL | Freq: Once | INTRAVENOUS | Status: AC | PRN
  Administered 2012-08-08: 80 mL via INTRAVENOUS

## 2012-08-08 NOTE — Progress Notes (Signed)
The patient presents today for followup of CT scan obtained today. I had seen him last week at which time ultrasound had shown progression in size of his infrarenal abdominal aortic aneurysm. He has no change in his history or physical in the last one week.  Past Medical History  Diagnosis Date  . ASHD (arteriosclerotic heart disease)   . Dyslipidemia   . GERD (gastroesophageal reflux disease)   . Obesity   . Hemorrhoids   . AAA (abdominal aortic aneurysm) 2007  . ED (erectile dysfunction)   . Hyperlipidemia   . Hypertension   . Myocardial infarction   . SVT (supraventricular tachycardia)     History  Substance Use Topics  . Smoking status: Former Smoker -- 35 years    Types: Cigarettes    Quit date: 04/21/2005  . Smokeless tobacco: Never Used  . Alcohol Use: No    Family History  Problem Relation Age of Onset  . Stroke Mother   . Hypertension Father   . Heart disease Maternal Uncle     Allergies  Allergen Reactions  . Lipitor (Atorvastatin Calcium)     MUSCLE ACHES    Current outpatient prescriptions:aspirin EC 81 MG tablet, Take 81 mg by mouth daily.  , Disp: , Rfl: ;  atorvastatin (LIPITOR) 20 MG tablet, TAKE 1 TABLET DAILY AT BEDTIME, Disp: 90 tablet, Rfl: PRN;  hydrochlorothiazide (HYDRODIURIL) 25 MG tablet, Take 1 tablet (25 mg total) by mouth daily., Disp: 90 tablet, Rfl: 1;  metoprolol tartrate (LOPRESSOR) 25 MG tablet, TAKE 1 TABLET TWICE DAILY, Disp: 180 tablet, Rfl: PRN nitroGLYCERIN (NITROSTAT) 0.4 MG SL tablet, Place 1 tablet (0.4 mg total) under the tongue every 5 (five) minutes as needed., Disp: 90 tablet, Rfl: 0;  potassium chloride (K-DUR,KLOR-CON) 10 MEQ tablet, Take 1 tablet (10 mEq total) by mouth daily., Disp: 90 tablet, Rfl: 1;  ranitidine (ZANTAC) 150 MG tablet, TAKE 1 TABLET AT BEDTIME, Disp: 90 tablet, Rfl: PRN  BP 142/79  Pulse 72  Ht 6' (1.829 m)  Wt 247 lb (112.038 kg)  BMI 33.49 kg/m2  SpO2 97%  Body mass index is 33.49  kg/(m^2).       CT scan today shows infrarenal abdominal aortic aneurysm with maximal diameter of 5.5 cm. This does extend into the left iliac artery. 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 I do feel that he would be a candidate for stent graft with either a layered limb of the left iliac or preoperative coiling of the internal iliac with extension into the external iliac on the left. 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 Also explained this may make him at some increased risk for persistent endoleak.well as lained that he would have loss of the small area that this artery.   The pt is for cardiac clearance.  He wishes to procede with stent graft repair

## 2012-08-09 ENCOUNTER — Ambulatory Visit (HOSPITAL_COMMUNITY): Payer: Medicare HMO | Attending: Cardiovascular Disease | Admitting: Radiology

## 2012-08-09 ENCOUNTER — Encounter (HOSPITAL_COMMUNITY): Payer: Medicare HMO

## 2012-08-09 VITALS — BP 114/76 | Ht 72.0 in | Wt 242.0 lb

## 2012-08-09 DIAGNOSIS — I714 Abdominal aortic aneurysm, without rupture, unspecified: Secondary | ICD-10-CM | POA: Insufficient documentation

## 2012-08-09 DIAGNOSIS — J438 Other emphysema: Secondary | ICD-10-CM | POA: Insufficient documentation

## 2012-08-09 DIAGNOSIS — E785 Hyperlipidemia, unspecified: Secondary | ICD-10-CM | POA: Insufficient documentation

## 2012-08-09 DIAGNOSIS — Z01818 Encounter for other preprocedural examination: Secondary | ICD-10-CM

## 2012-08-09 DIAGNOSIS — E669 Obesity, unspecified: Secondary | ICD-10-CM | POA: Insufficient documentation

## 2012-08-09 DIAGNOSIS — Z951 Presence of aortocoronary bypass graft: Secondary | ICD-10-CM | POA: Insufficient documentation

## 2012-08-09 DIAGNOSIS — I10 Essential (primary) hypertension: Secondary | ICD-10-CM | POA: Insufficient documentation

## 2012-08-09 DIAGNOSIS — R0989 Other specified symptoms and signs involving the circulatory and respiratory systems: Secondary | ICD-10-CM | POA: Insufficient documentation

## 2012-08-09 DIAGNOSIS — R0609 Other forms of dyspnea: Secondary | ICD-10-CM | POA: Insufficient documentation

## 2012-08-09 DIAGNOSIS — I2510 Atherosclerotic heart disease of native coronary artery without angina pectoris: Secondary | ICD-10-CM

## 2012-08-09 MED ORDER — REGADENOSON 0.4 MG/5ML IV SOLN
0.4000 mg | Freq: Once | INTRAVENOUS | Status: AC
  Administered 2012-08-09: 0.4 mg via INTRAVENOUS

## 2012-08-09 MED ORDER — TECHNETIUM TC 99M SESTAMIBI GENERIC - CARDIOLITE
30.0000 | Freq: Once | INTRAVENOUS | Status: AC | PRN
  Administered 2012-08-09: 30 via INTRAVENOUS

## 2012-08-09 NOTE — Progress Notes (Signed)
[facility] SITE 3 NUCLEAR MED [address] [place], [place] [address] [phone]    Cardiology Nuclear Med Study  TRACEY STEWART is a 67 y.o. male     MRN : [id number]     DOB: 02-02-1946  Procedure Date: 08/09/2012  Nuclear Med Background Indication for Stress Test:  Evaluation for Ischemia,Graft Patency, and Pending Surgical Clearance for  AAA (5.3CM) by Dr Tawanna Cooler Early History:  COPD, Emphysema and '02 Heart Cath: EF: 40% Severe 3V DZ -CABG '07 ECHO: EF: 55-60%, '08 EF: 68% (-) ischemia, AAA 5.3 Cardiac Risk Factors: History of Smoking, Hypertension, Lipids and Obesity  Symptoms:  DOE   Nuclear Pre-Procedure Caffeine/Decaff Intake:  None > 12 hrs NPO After: 9:00pm   Lungs:  clear O2 Sat: 98% on room air. IV 0.9% NS with Angio Cath:  22g  IV Site: Above R Antecubital , tolerated well IV Started by:  Irean Hong, RN  Chest Size (in):  44 Cup Size: n/a  Height: 6' (1.829 m)  Weight:  242 lb (109.77 kg)  BMI:  Body mass index is 32.81 kg/(m^2). Tech Comments:  Took Lopressor on arrival @ 9:10 am    Nuclear Med Study 1 or 2 day study: 2- Day Stress Test Type:  Lexiscan  Reading QM:[id number] Atilla Zollner,M.D. Order Authorizing Provider:  Kristeen Miss, MD  Resting Radionuclide: Technetium 2m Sestamibi  Resting Radionuclide Dose: 33.0 mCi   On      08-14-12  Stress Radionuclide:  Technetium 42m Sestamibi  Stress Radionuclide Dose: 33.0 mCi   On        08-09-12          Stress Protocol Rest HR: 67 Stress HR: 86  Rest BP: 114/76 Stress BP: 132/91  Exercise Time (min): n/a METS: n/a   Predicted Max HR: 152 bpm % Max HR: 56.58 bpm Rate Pressure Product: 96295   Dose of Adenosine (mg):  n/a Dose of Lexiscan: 0.4 mg  Dose of Atropine (mg): n/a Dose of Dobutamine: n/a mcg/kg/min (at max HR)  Stress Test Technologist: Milana Na, EMT-P  Nuclear Technologist:  Domenic Polite, CNMT     Rest Procedure:  Myocardial perfusion imaging was performed at rest 45 minutes  following the intravenous administration of Technetium 77m Sestamibi. Rest ECG: NSR with non-specific ST-T wave changes  Stress Procedure:  The patient received IV Lexiscan 0.4 mg over 15-seconds.  Technetium 36m Sestamibi injected at 30-seconds. This patient had dizziness, sob, and was flushed with Lexiscan. Quantitative spect images were obtained after a 45 minute delay. Stress ECG: No significant ST segment change suggestive of ischemia.  QPS Raw Data Images:  Images were motion corrected.  Soft tissue (diaphragm, bowel activity, subcutaneous fat) surround heart. Stress Images:  Normal homogeneous uptake in all areas of the myocardium. Rest Images:  Normal homogeneous uptake in all areas of the myocardium. Subtraction (SDS):  No evidence of ischemia. Transient Ischemic Dilatation (Normal <1.22):  1.02 Lung/Heart Ratio (Normal <0.45):  0.37  Quantitative Gated Spect Images QGS EDV:  83 ml QGS ESV:  25 ml  Impression Exercise Capacity:  Lexiscan with no exercise. BP Response:  Normal blood pressure response. Clinical Symptoms:  No chest pain. ECG Impression:  No significant ST segment change suggestive of ischemia. Comparison with Prior Nuclear Study:  No change from report of 2008  Overall Impression:  Normal stress nuclear study.  LV Ejection Fraction: 70%.  LV Wall Motion:  NL LV Function; NL Wall Motion  Dietrich Pates

## 2012-08-10 ENCOUNTER — Encounter (HOSPITAL_COMMUNITY): Payer: Medicare HMO

## 2012-08-14 ENCOUNTER — Ambulatory Visit (HOSPITAL_COMMUNITY): Payer: Medicare HMO | Admitting: Radiology

## 2012-08-14 DIAGNOSIS — R0989 Other specified symptoms and signs involving the circulatory and respiratory systems: Secondary | ICD-10-CM

## 2012-08-14 MED ORDER — TECHNETIUM TC 99M SESTAMIBI GENERIC - CARDIOLITE
33.0000 | Freq: Once | INTRAVENOUS | Status: AC | PRN
  Administered 2012-08-14: 33 via INTRAVENOUS

## 2012-08-15 ENCOUNTER — Encounter: Payer: Self-pay | Admitting: *Deleted

## 2012-08-15 ENCOUNTER — Other Ambulatory Visit: Payer: Self-pay | Admitting: *Deleted

## 2012-08-16 ENCOUNTER — Encounter: Payer: Self-pay | Admitting: Vascular Surgery

## 2012-08-17 ENCOUNTER — Encounter (HOSPITAL_COMMUNITY): Payer: Self-pay | Admitting: Pharmacy Technician

## 2012-08-18 ENCOUNTER — Encounter: Payer: Self-pay | Admitting: Vascular Surgery

## 2012-08-23 ENCOUNTER — Encounter (HOSPITAL_COMMUNITY)
Admission: RE | Admit: 2012-08-23 | Discharge: 2012-08-23 | Disposition: A | Discharge status: Home / Self Care | Payer: Medicare HMO | Source: Ambulatory Visit | Attending: Vascular Surgery | Admitting: Vascular Surgery

## 2012-08-23 ENCOUNTER — Encounter (HOSPITAL_COMMUNITY)
Admission: RE | Admit: 2012-08-23 | Discharge: 2012-08-23 | Disposition: A | Discharge status: Home / Self Care | Payer: Medicare HMO | Source: Ambulatory Visit | Attending: Anesthesiology | Admitting: Anesthesiology

## 2012-08-23 ENCOUNTER — Encounter (HOSPITAL_COMMUNITY): Payer: Self-pay

## 2012-08-23 HISTORY — DX: Personal history of colon polyps, unspecified: Z86.0100

## 2012-08-23 HISTORY — DX: Nausea with vomiting, unspecified: R11.2

## 2012-08-23 HISTORY — DX: Emphysema, unspecified: J43.9

## 2012-08-23 HISTORY — DX: Nausea with vomiting, unspecified: Z98.890

## 2012-08-23 HISTORY — DX: Personal history of colonic polyps: Z86.010

## 2012-08-23 LAB — BLOOD GAS, ARTERIAL
Bicarbonate: 25.1 mEq/L — ABNORMAL HIGH (ref 20.0–24.0)
FIO2: 0.21 %
Patient temperature: 98.6
TCO2: 26.2 mmol/L (ref 0–100)
pCO2 arterial: 36.8 mmHg (ref 35.0–45.0)
pH, Arterial: 7.448 (ref 7.350–7.450)
pO2, Arterial: 79.1 mmHg — ABNORMAL LOW (ref 80.0–100.0)

## 2012-08-23 LAB — CBC
HCT: 46.6 % (ref 39.0–52.0)
MCV: 92.3 fL (ref 78.0–100.0)
RBC: 5.05 MIL/uL (ref 4.22–5.81)
RDW: 13.5 % (ref 11.5–15.5)
WBC: 6.9 10*3/uL (ref 4.0–10.5)

## 2012-08-23 LAB — URINALYSIS, ROUTINE W REFLEX MICROSCOPIC
Glucose, UA: NEGATIVE mg/dL
Leukocytes, UA: NEGATIVE
Nitrite: NEGATIVE
Specific Gravity, Urine: 1.017 (ref 1.005–1.030)
pH: 5 (ref 5.0–8.0)

## 2012-08-23 LAB — COMPREHENSIVE METABOLIC PANEL
AST: 22 U/L (ref 0–37)
Albumin: 3.5 g/dL (ref 3.5–5.2)
Alkaline Phosphatase: 126 U/L — ABNORMAL HIGH (ref 39–117)
BUN: 13 mg/dL (ref 6–23)
CO2: 21 mEq/L (ref 19–32)
Chloride: 101 mEq/L (ref 96–112)
GFR calc non Af Amer: 86 mL/min — ABNORMAL LOW (ref >90)
Potassium: 4.3 mEq/L (ref 3.5–5.1)
Total Bilirubin: 0.7 mg/dL (ref 0.3–1.2)

## 2012-08-23 LAB — PROTIME-INR: Prothrombin Time: 12.2 seconds (ref 11.6–15.2)

## 2012-08-23 LAB — TYPE AND SCREEN

## 2012-08-23 LAB — SURGICAL PCR SCREEN: Staphylococcus aureus: NEGATIVE

## 2012-08-23 NOTE — Pre-Procedure Instructions (Signed)
Martin Dickerson  08/23/2012   Your procedure is scheduled on:  Mon, Mar 3 @ 7:30 AM  Report to [facility] at 5:30 AM.  Call this number if you have problems the morning of surgery: [phone]   Remember:   Do not eat food or drink liquids after midnight.   Take these medicines the morning of surgery with A SIP OF WATER: Metoprolol(Lopressor)   Do not wear jewelry  Do not wear lotions, powders, or colognes. You may wear deodorant.  Men may shave face and neck.  Do not bring valuables to the hospital.  Contacts, dentures or bridgework may not be worn into surgery.  Leave suitcase in the car. After surgery it may be brought to your room.  For patients admitted to the hospital, checkout time is 11:00 AM the day of  discharge.   Patients discharged the day of surgery will not be allowed to drive  home.    Special Instructions: Shower using CHG 2 nights before surgery and the night before surgery.  If you shower the day of surgery use CHG.  Use special wash - you have one bottle of CHG for all showers.  You should use approximately 1/3 of the bottle for each shower.   Please read over the following fact sheets that you were given: Pain Booklet, Coughing and Deep Breathing, Blood Transfusion Information, MRSA Information and Surgical Site Infection Prevention

## 2012-08-23 NOTE — Progress Notes (Addendum)
Dr.Nahser is cardiologist with last office visit in epic from 08/01/12  Stress test in epic from 2008 and 08/17/12  Heart cath in epic from 2008  Unsure if an echo has ever been done  EKG in epic from 07/17/12  Denies CXR being done within past yr  Dr.John Susann Givens is Medical MD

## 2012-08-27 MED ORDER — DEXTROSE 5 % IV SOLN
1.5000 g | INTRAVENOUS | Status: AC
  Administered 2012-08-28: 1.5 g via INTRAVENOUS
  Filled 2012-08-27: qty 1.5

## 2012-08-27 MED ORDER — SODIUM CHLORIDE 0.9 % IV SOLN: INTRAVENOUS | Status: DC

## 2012-08-28 ENCOUNTER — Inpatient Hospital Stay (HOSPITAL_COMMUNITY): Payer: Medicare HMO

## 2012-08-28 ENCOUNTER — Encounter (HOSPITAL_COMMUNITY): Payer: Self-pay | Admitting: Anesthesiology

## 2012-08-28 ENCOUNTER — Encounter (HOSPITAL_COMMUNITY)
Admission: RE | Disposition: A | Discharge status: Home / Self Care | Payer: Self-pay | Source: Ambulatory Visit | Attending: Vascular Surgery

## 2012-08-28 ENCOUNTER — Inpatient Hospital Stay (HOSPITAL_COMMUNITY): Payer: Medicare HMO | Admitting: Anesthesiology

## 2012-08-28 ENCOUNTER — Inpatient Hospital Stay (HOSPITAL_COMMUNITY)
Admission: RE | Admit: 2012-08-28 | Discharge: 2012-08-29 | DRG: 238 | Disposition: A | Discharge status: Home / Self Care | Payer: Medicare HMO | Source: Ambulatory Visit | Attending: Vascular Surgery | Admitting: Vascular Surgery

## 2012-08-28 DIAGNOSIS — Z01818 Encounter for other preprocedural examination: Secondary | ICD-10-CM

## 2012-08-28 DIAGNOSIS — E785 Hyperlipidemia, unspecified: Secondary | ICD-10-CM | POA: Diagnosis present

## 2012-08-28 DIAGNOSIS — I714 Abdominal aortic aneurysm, without rupture, unspecified: Principal | ICD-10-CM

## 2012-08-28 DIAGNOSIS — Z79899 Other long term (current) drug therapy: Secondary | ICD-10-CM

## 2012-08-28 DIAGNOSIS — Z7982 Long term (current) use of aspirin: Secondary | ICD-10-CM

## 2012-08-28 DIAGNOSIS — K219 Gastro-esophageal reflux disease without esophagitis: Secondary | ICD-10-CM | POA: Diagnosis present

## 2012-08-28 DIAGNOSIS — Z87891 Personal history of nicotine dependence: Secondary | ICD-10-CM

## 2012-08-28 DIAGNOSIS — I2510 Atherosclerotic heart disease of native coronary artery without angina pectoris: Secondary | ICD-10-CM | POA: Diagnosis present

## 2012-08-28 DIAGNOSIS — Z01812 Encounter for preprocedural laboratory examination: Secondary | ICD-10-CM

## 2012-08-28 DIAGNOSIS — I252 Old myocardial infarction: Secondary | ICD-10-CM

## 2012-08-28 DIAGNOSIS — E669 Obesity, unspecified: Secondary | ICD-10-CM | POA: Diagnosis present

## 2012-08-28 DIAGNOSIS — I10 Essential (primary) hypertension: Secondary | ICD-10-CM | POA: Diagnosis present

## 2012-08-28 HISTORY — PX: ABDOMINAL AORTIC ENDOVASCULAR STENT GRAFT: SHX5707

## 2012-08-28 LAB — BASIC METABOLIC PANEL
CO2: 25 mEq/L (ref 19–32)
Calcium: 8.8 mg/dL (ref 8.4–10.5)
GFR calc non Af Amer: 84 mL/min — ABNORMAL LOW (ref >90)
Potassium: 3.9 mEq/L (ref 3.5–5.1)
Sodium: 137 mEq/L (ref 135–145)

## 2012-08-28 LAB — CBC
MCH: 31.8 pg (ref 26.0–34.0)
Platelets: 128 10*3/uL — ABNORMAL LOW (ref 150–400)
RBC: 4.47 MIL/uL (ref 4.22–5.81)
WBC: 6.9 10*3/uL (ref 4.0–10.5)

## 2012-08-28 LAB — PROTIME-INR
INR: 0.98 (ref 0.00–1.49)
Prothrombin Time: 12.9 seconds (ref 11.6–15.2)

## 2012-08-28 LAB — MAGNESIUM: Magnesium: 2 mg/dL (ref 1.5–2.5)

## 2012-08-28 SURGERY — INSERTION, ENDOVASCULAR STENT GRAFT, AORTA, ABDOMINAL
Anesthesia: General | Wound class: Clean

## 2012-08-28 MED ORDER — CEFUROXIME SODIUM 1.5 G IJ SOLR
1.5000 g | Freq: Two times a day (BID) | INTRAMUSCULAR | Status: AC
Start: 2012-08-28 — End: 2012-08-29
  Administered 2012-08-28 – 2012-08-29 (×2): 1.5 g via INTRAVENOUS
  Filled 2012-08-28 (×2): qty 1.5

## 2012-08-28 MED ORDER — POTASSIUM CHLORIDE CRYS ER 10 MEQ PO TBCR
10.0000 meq | EXTENDED_RELEASE_TABLET | Freq: Every day | ORAL | Status: DC
  Administered 2012-08-28 – 2012-08-29 (×2): 10 meq via ORAL
  Filled 2012-08-28 (×2): qty 1

## 2012-08-28 MED ORDER — GUAIFENESIN-DM 100-10 MG/5ML PO SYRP: 15.0000 mL | ORAL_SOLUTION | ORAL | Status: DC | PRN

## 2012-08-28 MED ORDER — PHENYLEPHRINE HCL 10 MG/ML IJ SOLN
10.0000 mg | INTRAMUSCULAR | Status: DC | PRN
  Administered 2012-08-28: 50 ug/min via INTRAVENOUS

## 2012-08-28 MED ORDER — IODIXANOL 320 MG/ML IV SOLN
INTRAVENOUS | Status: DC | PRN
  Administered 2012-08-28: 150 mL via INTRA_ARTERIAL

## 2012-08-28 MED ORDER — FAMOTIDINE 20 MG PO TABS
20.0000 mg | ORAL_TABLET | Freq: Every day | ORAL | Status: DC
  Administered 2012-08-28 – 2012-08-29 (×2): 20 mg via ORAL
  Filled 2012-08-28 (×2): qty 1

## 2012-08-28 MED ORDER — ACETAMINOPHEN 325 MG PO TABS: 325.0000 mg | ORAL_TABLET | ORAL | Status: DC | PRN

## 2012-08-28 MED ORDER — POTASSIUM CHLORIDE CRYS ER 20 MEQ PO TBCR: 20.0000 meq | EXTENDED_RELEASE_TABLET | Freq: Once | ORAL | Status: AC | PRN

## 2012-08-28 MED ORDER — DEXAMETHASONE SODIUM PHOSPHATE 4 MG/ML IJ SOLN
INTRAMUSCULAR | Status: DC | PRN
  Administered 2012-08-28: 8 mg via INTRAVENOUS

## 2012-08-28 MED ORDER — ATORVASTATIN CALCIUM 20 MG PO TABS
20.0000 mg | ORAL_TABLET | Freq: Every day | ORAL | Status: DC
  Administered 2012-08-28: 20 mg via ORAL
  Filled 2012-08-28 (×2): qty 1

## 2012-08-28 MED ORDER — HYDROCHLOROTHIAZIDE 25 MG PO TABS
25.0000 mg | ORAL_TABLET | Freq: Every day | ORAL | Status: DC
  Administered 2012-08-29: 25 mg via ORAL
  Filled 2012-08-28: qty 1

## 2012-08-28 MED ORDER — ALUM & MAG HYDROXIDE-SIMETH 200-200-20 MG/5ML PO SUSP: 15.0000 mL | ORAL | Status: DC | PRN

## 2012-08-28 MED ORDER — ASPIRIN EC 81 MG PO TBEC
81.0000 mg | DELAYED_RELEASE_TABLET | Freq: Every day | ORAL | Status: DC
  Administered 2012-08-28: 81 mg via ORAL
  Filled 2012-08-28 (×2): qty 1

## 2012-08-28 MED ORDER — NEOSTIGMINE METHYLSULFATE 1 MG/ML IJ SOLN
INTRAMUSCULAR | Status: DC | PRN
  Administered 2012-08-28: 3 mg via INTRAVENOUS

## 2012-08-28 MED ORDER — ACETAMINOPHEN 650 MG RE SUPP: 325.0000 mg | RECTAL | Status: DC | PRN

## 2012-08-28 MED ORDER — OXYCODONE-ACETAMINOPHEN 5-325 MG PO TABS
1.0000 | ORAL_TABLET | ORAL | Status: DC | PRN
  Administered 2012-08-29 (×2): 1 via ORAL
  Filled 2012-08-28 (×2): qty 1

## 2012-08-28 MED ORDER — GLYCOPYRROLATE 0.2 MG/ML IJ SOLN
INTRAMUSCULAR | Status: DC | PRN
  Administered 2012-08-28: 0.4 mg via INTRAVENOUS

## 2012-08-28 MED ORDER — ONDANSETRON HCL 4 MG/2ML IJ SOLN: 4.0000 mg | Freq: Four times a day (QID) | INTRAMUSCULAR | Status: DC | PRN

## 2012-08-28 MED ORDER — PHENYLEPHRINE HCL 10 MG/ML IJ SOLN
INTRAMUSCULAR | Status: DC | PRN
  Administered 2012-08-28 (×5): 80 ug via INTRAVENOUS

## 2012-08-28 MED ORDER — ROCURONIUM BROMIDE 100 MG/10ML IV SOLN
INTRAVENOUS | Status: DC | PRN
  Administered 2012-08-28: 50 mg via INTRAVENOUS

## 2012-08-28 MED ORDER — MORPHINE SULFATE 2 MG/ML IJ SOLN
2.0000 mg | INTRAMUSCULAR | Status: DC | PRN
  Administered 2012-08-28: 2 mg via INTRAVENOUS
  Administered 2012-08-28: 4 mg via INTRAVENOUS
  Filled 2012-08-28: qty 2
  Filled 2012-08-28: qty 1

## 2012-08-28 MED ORDER — METOPROLOL TARTRATE 1 MG/ML IV SOLN: 2.0000 mg | INTRAVENOUS | Status: DC | PRN

## 2012-08-28 MED ORDER — PHENOL 1.4 % MT LIQD: 1.0000 | OROMUCOSAL | Status: DC | PRN

## 2012-08-28 MED ORDER — BISACODYL 10 MG RE SUPP: 10.0000 mg | Freq: Every day | RECTAL | Status: DC | PRN

## 2012-08-28 MED ORDER — LIDOCAINE HCL (CARDIAC) 20 MG/ML IV SOLN
INTRAVENOUS | Status: DC | PRN
  Administered 2012-08-28: 100 mg via INTRAVENOUS

## 2012-08-28 MED ORDER — SODIUM CHLORIDE 0.9 % IR SOLN
Status: DC | PRN
  Administered 2012-08-28: 08:00:00

## 2012-08-28 MED ORDER — MIDAZOLAM HCL 2 MG/2ML IJ SOLN
INTRAMUSCULAR | Status: AC
  Administered 2012-08-28: 2 mg via INTRAVENOUS
  Filled 2012-08-28: qty 2

## 2012-08-28 MED ORDER — HYDRALAZINE HCL 20 MG/ML IJ SOLN
INTRAMUSCULAR | Status: AC
  Filled 2012-08-28: qty 1

## 2012-08-28 MED ORDER — ONDANSETRON HCL 4 MG/2ML IJ SOLN
INTRAMUSCULAR | Status: DC | PRN
  Administered 2012-08-28: 4 mg via INTRAVENOUS

## 2012-08-28 MED ORDER — SODIUM CHLORIDE 0.9 % IV SOLN: 500.0000 mL | Freq: Once | INTRAVENOUS | Status: AC | PRN

## 2012-08-28 MED ORDER — NITROGLYCERIN 0.4 MG SL SUBL: 0.4000 mg | SUBLINGUAL_TABLET | SUBLINGUAL | Status: DC | PRN

## 2012-08-28 MED ORDER — METOPROLOL TARTRATE 25 MG PO TABS
25.0000 mg | ORAL_TABLET | Freq: Two times a day (BID) | ORAL | Status: DC
Start: 2012-08-28 — End: 2012-08-29
  Administered 2012-08-28 – 2012-08-29 (×2): 25 mg via ORAL
  Filled 2012-08-28 (×4): qty 1

## 2012-08-28 MED ORDER — 0.9 % SODIUM CHLORIDE (POUR BTL) OPTIME
TOPICAL | Status: DC | PRN
  Administered 2012-08-28: 1000 mL

## 2012-08-28 MED ORDER — DOCUSATE SODIUM 100 MG PO CAPS
100.0000 mg | ORAL_CAPSULE | Freq: Every day | ORAL | Status: DC
  Administered 2012-08-29: 100 mg via ORAL
  Filled 2012-08-28: qty 1

## 2012-08-28 MED ORDER — ONDANSETRON HCL 4 MG/2ML IJ SOLN: 4.0000 mg | Freq: Once | INTRAMUSCULAR | Status: DC | PRN

## 2012-08-28 MED ORDER — OXYCODONE HCL 5 MG PO TABS: 5.0000 mg | ORAL_TABLET | Freq: Once | ORAL | Status: DC | PRN

## 2012-08-28 MED ORDER — PANTOPRAZOLE SODIUM 40 MG PO TBEC
40.0000 mg | DELAYED_RELEASE_TABLET | Freq: Every day | ORAL | Status: DC
  Administered 2012-08-28 – 2012-08-29 (×2): 40 mg via ORAL
  Filled 2012-08-28 (×2): qty 1

## 2012-08-28 MED ORDER — DOPAMINE-DEXTROSE 3.2-5 MG/ML-% IV SOLN: 3.0000 ug/kg/min | INTRAVENOUS | Status: DC

## 2012-08-28 MED ORDER — FENTANYL CITRATE 0.05 MG/ML IJ SOLN
INTRAMUSCULAR | Status: AC
  Administered 2012-08-28: 50 ug via INTRAVENOUS
  Administered 2012-08-28: 100 ug via INTRAVENOUS
  Administered 2012-08-28: 150 ug via INTRAVENOUS
  Filled 2012-08-28: qty 2

## 2012-08-28 MED ORDER — MEPERIDINE HCL 25 MG/ML IJ SOLN: 6.2500 mg | INTRAMUSCULAR | Status: DC | PRN

## 2012-08-28 MED ORDER — PROPOFOL 10 MG/ML IV BOLUS
INTRAVENOUS | Status: DC | PRN
  Administered 2012-08-28: 120 mg via INTRAVENOUS

## 2012-08-28 MED ORDER — SENNOSIDES-DOCUSATE SODIUM 8.6-50 MG PO TABS
1.0000 | ORAL_TABLET | Freq: Every evening | ORAL | Status: DC | PRN
  Filled 2012-08-28: qty 1

## 2012-08-28 MED ORDER — HYDROMORPHONE HCL PF 1 MG/ML IJ SOLN: 0.2500 mg | INTRAMUSCULAR | Status: DC | PRN

## 2012-08-28 MED ORDER — PROTAMINE SULFATE 10 MG/ML IV SOLN
INTRAVENOUS | Status: DC | PRN
  Administered 2012-08-28: 50 mg via INTRAVENOUS

## 2012-08-28 MED ORDER — HEPARIN SODIUM (PORCINE) 1000 UNIT/ML IJ SOLN
INTRAMUSCULAR | Status: DC | PRN
  Administered 2012-08-28: 7000 [IU] via INTRAVENOUS

## 2012-08-28 MED ORDER — LABETALOL HCL 5 MG/ML IV SOLN
INTRAVENOUS | Status: DC | PRN
  Administered 2012-08-28: 10 mg via INTRAVENOUS

## 2012-08-28 MED ORDER — LABETALOL HCL 5 MG/ML IV SOLN: 10.0000 mg | INTRAVENOUS | Status: DC | PRN

## 2012-08-28 MED ORDER — LACTATED RINGERS IV SOLN
INTRAVENOUS | Status: DC | PRN
  Administered 2012-08-28 (×2): via INTRAVENOUS

## 2012-08-28 MED ORDER — HYDRALAZINE HCL 20 MG/ML IJ SOLN
10.0000 mg | INTRAMUSCULAR | Status: DC | PRN
  Administered 2012-08-28: 10 mg via INTRAVENOUS

## 2012-08-28 MED ORDER — SODIUM CHLORIDE 0.9 % IV SOLN
INTRAVENOUS | Status: DC
  Administered 2012-08-28: 14:00:00 via INTRAVENOUS
  Administered 2012-08-29: 1000 mL via INTRAVENOUS

## 2012-08-28 MED ORDER — OXYCODONE HCL 5 MG/5ML PO SOLN: 5.0000 mg | Freq: Once | ORAL | Status: DC | PRN

## 2012-08-28 MED ORDER — MAGNESIUM SULFATE 40 MG/ML IJ SOLN
2.0000 g | Freq: Once | INTRAMUSCULAR | Status: AC | PRN
  Filled 2012-08-28: qty 50

## 2012-08-28 SURGICAL SUPPLY — 83 items
APL SKNCLS STERI-STRIP NONHPOA (GAUZE/BANDAGES/DRESSINGS) ×1
BAG BANDED W/RUBBER/TAPE 36X54 (MISCELLANEOUS) ×2 IMPLANT
BAG DECANTER FOR FLEXI CONT (MISCELLANEOUS) IMPLANT
BAG EQP BAND 135X91 W/RBR TAPE (MISCELLANEOUS) ×1
BAG SNAP BAND KOVER 36X36 (MISCELLANEOUS) ×2 IMPLANT
BALLN CODA OCL 2-9.0-35-120-3 (BALLOONS)
BALLOON COD OCL 2-9.0-35-120-3 (BALLOONS) IMPLANT
BENZOIN TINCTURE PRP APPL 2/3 (GAUZE/BANDAGES/DRESSINGS) ×2 IMPLANT
CANISTER SUCTION 2500CC (MISCELLANEOUS) ×2 IMPLANT
CATH OMNI FLUSH .035X70CM (CATHETERS) ×1 IMPLANT
CLIP LIGATING EXTRA MED SLVR (CLIP) ×1 IMPLANT
CLIP LIGATING EXTRA SM BLUE (MISCELLANEOUS) ×1 IMPLANT
CLOTH BEACON ORANGE TIMEOUT ST (SAFETY) ×2 IMPLANT
COVER DOME SNAP 22 D (MISCELLANEOUS) ×2 IMPLANT
COVER MAYO STAND STRL (DRAPES) ×2 IMPLANT
COVER PROBE W GEL 5X96 (DRAPES) ×2 IMPLANT
COVER SURGICAL LIGHT HANDLE (MISCELLANEOUS) ×2 IMPLANT
DEVICE CLOSURE PERCLS PRGLD 6F (VASCULAR PRODUCTS) IMPLANT
DRAIN CHANNEL 10F 3/8 F FF (DRAIN) IMPLANT
DRAIN CHANNEL 10M FLAT 3/4 FLT (DRAIN) IMPLANT
DRAPE TABLE COVER HEAVY DUTY (DRAPES) ×2 IMPLANT
DRESSING OPSITE X SMALL 2X3 (GAUZE/BANDAGES/DRESSINGS) ×4 IMPLANT
DRYSEAL FLEXSHEATH 12FR 33CM (SHEATH) ×1
DRYSEAL FLEXSHEATH 18FR 33CM (SHEATH) ×1
ELECT CAUTERY BLADE 6.4 (BLADE) ×2 IMPLANT
ELECT REM PT RETURN 9FT ADLT (ELECTROSURGICAL) ×4
ELECTRODE REM PT RTRN 9FT ADLT (ELECTROSURGICAL) ×2 IMPLANT
EVACUATOR 3/16  PVC DRAIN (DRAIN)
EVACUATOR 3/16 PVC DRAIN (DRAIN) IMPLANT
EVACUATOR SILICONE 100CC (DRAIN) IMPLANT
GAUZE SPONGE 2X2 8PLY STRL LF (GAUZE/BANDAGES/DRESSINGS) ×1 IMPLANT
GLOVE BIOGEL PI IND STRL 6.5 (GLOVE) IMPLANT
GLOVE BIOGEL PI IND STRL 7.5 (GLOVE) IMPLANT
GLOVE BIOGEL PI INDICATOR 6.5 (GLOVE) ×2
GLOVE BIOGEL PI INDICATOR 7.5 (GLOVE) ×1
GLOVE SS BIOGEL STRL SZ 7 (GLOVE) IMPLANT
GLOVE SS BIOGEL STRL SZ 7.5 (GLOVE) ×1 IMPLANT
GLOVE SUPERSENSE BIOGEL SZ 7 (GLOVE) ×1
GLOVE SUPERSENSE BIOGEL SZ 7.5 (GLOVE) ×1
GLOVE SURG SS PI 7.0 STRL IVOR (GLOVE) ×1 IMPLANT
GOWN STRL NON-REIN LRG LVL3 (GOWN DISPOSABLE) ×6 IMPLANT
GOWN STRL REIN XL XLG (GOWN DISPOSABLE) ×1 IMPLANT
GRAFT BALLN CATH 65CM (STENTS) IMPLANT
GRAFT ENDOPROSETHESIS 28/14/16 (Endovascular Graft) ×1 IMPLANT
GRAFT EXCLUDER LEG 14.5X12 (Endovascular Graft) ×1 IMPLANT
KIT BASIN OR (CUSTOM PROCEDURE TRAY) ×2 IMPLANT
KIT ROOM TURNOVER OR (KITS) ×2 IMPLANT
NDL PERC 18GX7CM (NEEDLE) ×1 IMPLANT
NEEDLE PERC 18GX7CM (NEEDLE) ×2 IMPLANT
NS IRRIG 1000ML POUR BTL (IV SOLUTION) ×2 IMPLANT
PACK AORTA (CUSTOM PROCEDURE TRAY) ×2 IMPLANT
PAD ARMBOARD 7.5X6 YLW CONV (MISCELLANEOUS) ×4 IMPLANT
PENCIL BUTTON HOLSTER BLD 10FT (ELECTRODE) ×2 IMPLANT
PERCLOSE PROGLIDE 6F (VASCULAR PRODUCTS) ×10
PROTECTION STATION PRESSURIZED (MISCELLANEOUS) ×2
SHEATH AVANTI 11CM 8FR (MISCELLANEOUS) ×1 IMPLANT
SHEATH BRITE TIP 8FR 23CM (MISCELLANEOUS) ×1 IMPLANT
SHEATH DRYSEAL FLEX 12FR 33CM (SHEATH) IMPLANT
SHEATH DRYSEAL FLEX 18FR 33CM (SHEATH) IMPLANT
SPONGE GAUZE 2X2 STER 10/PKG (GAUZE/BANDAGES/DRESSINGS) ×1
STAPLER VISISTAT 35W (STAPLE) IMPLANT
STATION PROTECTION PRESSURIZED (MISCELLANEOUS) ×1 IMPLANT
STENT GRAFT BALLN CATH 65CM (STENTS) ×1
STOPCOCK MORSE 400PSI 3WAY (MISCELLANEOUS) ×2 IMPLANT
STRIP CLOSURE SKIN 1/2X4 (GAUZE/BANDAGES/DRESSINGS) ×2 IMPLANT
SUT ETHILON 3 0 PS 1 (SUTURE) IMPLANT
SUT PROLENE 5 0 C 1 24 (SUTURE) IMPLANT
SUT VIC AB 2-0 CTX 36 (SUTURE) ×2 IMPLANT
SUT VIC AB 3-0 SH 18 (SUTURE) IMPLANT
SUT VIC AB 3-0 SH 27 (SUTURE)
SUT VIC AB 3-0 SH 27X BRD (SUTURE) ×2 IMPLANT
SUT VICRYL 4-0 PS2 18IN ABS (SUTURE) ×4 IMPLANT
SYR 20CC LL (SYRINGE) ×4 IMPLANT
SYR 30ML LL (SYRINGE) IMPLANT
SYR 5ML LL (SYRINGE) ×2 IMPLANT
SYR MEDRAD MARK V 150ML (SYRINGE) ×2 IMPLANT
SYRINGE 10CC LL (SYRINGE) ×6 IMPLANT
TOWEL OR 17X24 6PK STRL BLUE (TOWEL DISPOSABLE) ×4 IMPLANT
TOWEL OR 17X26 10 PK STRL BLUE (TOWEL DISPOSABLE) ×4 IMPLANT
TRAY FOLEY CATH 14FRSI W/METER (CATHETERS) ×2 IMPLANT
TUBING HIGH PRESSURE 120CM (CONNECTOR) ×2 IMPLANT
WIRE AMPLATZ SS-J .035X180CM (WIRE) ×2 IMPLANT
WIRE BENTSON .035X145CM (WIRE) ×2 IMPLANT

## 2012-08-28 NOTE — Anesthesia Postprocedure Evaluation (Signed)
Anesthesia Post Note  Patient: Martin Dickerson  Procedure(s) Performed: Procedure(s) (LRB): ABDOMINAL AORTIC ENDOVASCULAR STENT GRAFT (N/A)  Anesthesia type: general  Patient location: PACU  Post pain: Pain level controlled  Post assessment: Patient's Cardiovascular Status Stable  Last Vitals:  Filed Vitals:   08/28/12 1147  BP:   Pulse: 76  Temp:   Resp: 12    Post vital signs: Reviewed and stable  Level of consciousness: sedated  Complications: No apparent anesthesia complications

## 2012-08-28 NOTE — Anesthesia Preprocedure Evaluation (Signed)
Anesthesia Evaluation  Patient identified by MRN, date of birth, ID band Patient awake    Reviewed: Allergy & Precautions, H&P , NPO status , Patient's Chart, lab work & pertinent test results  History of Anesthesia Complications (+) PONV  Airway Mallampati: I TM Distance: >3 FB Neck ROM: Full    Dental   Pulmonary          Cardiovascular hypertension, Pt. on medications + CAD, + Past MI and + Peripheral Vascular Disease     Neuro/Psych    GI/Hepatic GERD-  Medicated and Controlled,  Endo/Other    Renal/GU      Musculoskeletal   Abdominal   Peds  Hematology   Anesthesia Other Findings   Reproductive/Obstetrics                           Anesthesia Physical Anesthesia Plan  ASA: III  Anesthesia Plan: General   Post-op Pain Management:    Induction: Intravenous  Airway Management Planned: Oral ETT  Additional Equipment: Arterial line and CVP  Intra-op Plan:   Post-operative Plan: Extubation in OR  Informed Consent: I have reviewed the patients History and Physical, chart, labs and discussed the procedure including the risks, benefits and alternatives for the proposed anesthesia with the patient or authorized representative who has indicated his/her understanding and acceptance.     Plan Discussed with: Surgeon and CRNA  Anesthesia Plan Comments:         Anesthesia Quick Evaluation

## 2012-08-28 NOTE — Transfer of Care (Signed)
Immediate Anesthesia Transfer of Care Note  Patient: Martin Dickerson  Procedure(s) Performed: Procedure(s) with comments: ABDOMINAL AORTIC ENDOVASCULAR STENT GRAFT (N/A) - Ultrasound guided  Patient Location: PACU  Anesthesia Type:General  Level of Consciousness: awake, alert , oriented and patient cooperative  Airway & Oxygen Therapy: Patient Spontanous Breathing and Patient connected to nasal cannula oxygen  Post-op Assessment: Report given to PACU RN, Post -op Vital signs reviewed and stable and Patient moving all extremities X 4  Post vital signs: Reviewed and stable  Complications: No apparent anesthesia complications

## 2012-08-28 NOTE — Plan of Care (Signed)
Problem: Phase I Progression Outcomes Goal: Vascular site scale level 0 - I Vascular Site Scale Level 0: No bruising/bleeding/hematoma Level I (Mild): Bruising/Ecchymosis, minimal bleeding/ooozing, palpable hematoma < 3 cm Level II (Moderate): Bleeding not affecting hemodynamic parameters, pseudoaneurysm, palpable hematoma > 3 cm Outcome: Completed/Met Date Met:  08/28/12 Level 0

## 2012-08-28 NOTE — H&P (View-Only) (Signed)
The patient presents today for followup of CT scan obtained today. I had seen him last week at which time ultrasound had shown progression in size of his infrarenal abdominal aortic aneurysm. He has no change in his history or physical in the last one week.  Past Medical History  Diagnosis Date  . ASHD (arteriosclerotic heart disease)   . Dyslipidemia   . GERD (gastroesophageal reflux disease)   . Obesity   . Hemorrhoids   . AAA (abdominal aortic aneurysm) 2007  . ED (erectile dysfunction)   . Hyperlipidemia   . Hypertension   . Myocardial infarction   . SVT (supraventricular tachycardia)     History  Substance Use Topics  . Smoking status: Former Smoker -- 35 years    Types: Cigarettes    Quit date: 04/21/2005  . Smokeless tobacco: Never Used  . Alcohol Use: No    Family History  Problem Relation Age of Onset  . Stroke Mother   . Hypertension Father   . Heart disease Maternal Uncle     Allergies  Allergen Reactions  . Lipitor (Atorvastatin Calcium)     MUSCLE ACHES    Current outpatient prescriptions:aspirin EC 81 MG tablet, Take 81 mg by mouth daily.  , Disp: , Rfl: ;  atorvastatin (LIPITOR) 20 MG tablet, TAKE 1 TABLET DAILY AT BEDTIME, Disp: 90 tablet, Rfl: PRN;  hydrochlorothiazide (HYDRODIURIL) 25 MG tablet, Take 1 tablet (25 mg total) by mouth daily., Disp: 90 tablet, Rfl: 1;  metoprolol tartrate (LOPRESSOR) 25 MG tablet, TAKE 1 TABLET TWICE DAILY, Disp: 180 tablet, Rfl: PRN nitroGLYCERIN (NITROSTAT) 0.4 MG SL tablet, Place 1 tablet (0.4 mg total) under the tongue every 5 (five) minutes as needed., Disp: 90 tablet, Rfl: 0;  potassium chloride (K-DUR,KLOR-CON) 10 MEQ tablet, Take 1 tablet (10 mEq total) by mouth daily., Disp: 90 tablet, Rfl: 1;  ranitidine (ZANTAC) 150 MG tablet, TAKE 1 TABLET AT BEDTIME, Disp: 90 tablet, Rfl: PRN  BP 142/79  Pulse 72  Ht 6' (1.829 m)  Wt 247 lb (112.038 kg)  BMI 33.49 kg/m2  SpO2 97%  Body mass index is 33.49  kg/(m^2).       CT scan today shows infrarenal abdominal aortic aneurysm with maximal diameter of 5.5 cm. This does extend into the left iliac artery. 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 I do feel that he would be a candidate for stent graft with either a layered limb of the left iliac or preoperative coiling of the internal iliac with extension into the external iliac on the left. 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 Also explained this may make him at some increased risk for persistent endoleak.well as lained that he would have loss of the small area that this artery.   The pt is for cardiac clearance.  He wishes to procede with stent graft repair   

## 2012-08-28 NOTE — Op Note (Signed)
OPERATIVE REPORT  DATE OF SURGERY: 08/28/2012  PATIENT: Martin Dickerson, 67 y.o. male MRN: [id number]  DOB: [birth year]  PRE-OPERATIVE DIAGNOSIS: Abdominal aortic aneurysm, 5.5 cm  POST-OPERATIVE DIAGNOSIS:  Same  PROCEDURE: Gore stent graft repair of abdominal aortic aneurysm  SURGEON:  Gretta Began, M.D.  PHYSICIAN ASSISTANT: Collins  ANESTHESIA:  Gen.  EBL: 50 ml  Total I/O In: 1000 [I.V.:1000] Out: 150 [Urine:100; Blood:50]  BLOOD ADMINISTERED: None  DRAINS: None  SPECIMEN: None  COUNTS CORRECT:  YES  PLAN OF CARE: PACU stable   PATIENT DISPOSITION:  PACU - hemodynamically stable  PROCEDURE DETAILS: The patient was taken up replacing that is where the area of the abdomen and both groins were prepped and draped in usual sterile fashion. Using SonoSite ultrasound visualization the right common femoral artery was punctured with an 18-gauge needle and a guidewire was passed up to the level of the aorta. This was confirmed with fluoroscopy. 2 separate Perclose devices were positioned at 2:00 and 10:00 over the guidewire. An 8 French sheath was then passed over the guidewire. Similarly 2 Perclose devices were positioned in the left groin. An long 8 French sheath was placed through the left common femoral artery. A marker pigtail catheter was positioned level of the renal arteries and a ap injection was undertaken. This revealed location of the renal arteries the left renal artery being slightly lower than the right. The location hypogastric arteries was also noted. The 8 French sheath to the right groin was exchanged with a 18 French dry seal sheath. The patient was given 7000 intravenous heparin prior to this. The main body device was positioned with the limbs crossed this was a 28 x 14 x 16 cm length. This then had replacement of the pigtail catheter through the left groin over the wire. Magnification view was obtained at the level of the renal arteries and a additional 10 cc injection  was then obtained through the pigtail catheter showing the exact location. The main body was deployed and I had excellent positioning just below the left renal artery. The pigtail catheter was then withdrawn down into the aneurysm sac and using the long 8 sheath and the pigtail catheter and a Benson wire the contralateral gate was cannulated this was confirmed by twisting the pigtail catheter catheter and the main body. A hand injection through the left femoral sheath revealed the location hypogastric artery takeoff. The contralateral leg was a 14 x 12 cm and this was positioned appropriately and deployed. Finally the main body ipsilateral limb was deployed as well. A balloon dilatation of the proximal and distal insertion sites and the overlap site were all undertaken. The pigtail catheter was then repositioned above the level of the renal arteries and a completion arteriogram showed excellent positioning with no evidence of type I endoleak. There was late filling of the sac the lumbar collaterals.  The 18 French sheath was removed on the right leaving the guidewire in place and holding pressure for hemostasis. A 2 Perclose devices that were placed beginning the procedure were deployed leaving excellent hemostasis. Next similarly the left Perclose were closed after the removal of the 12 French sheath on the left. The patient was given 50 mg of protamine to reverse the heparin. The 2 sites and both groins were closed with a forcep take her Vicryl stitch. Sterile dressing was applied. The patient had 2+ dorsalis pedis pulses bilaterally at the completion of the procedure he was transferred to the recovery room in stable condition  Gretta Began, M.D. 08/28/2012 9:23 AM

## 2012-08-28 NOTE — Interval H&P Note (Signed)
History and Physical Interval Note:  08/28/2012 7:20 AM  Martin Dickerson  has presented today for surgery, with the diagnosis of Abdominal Aortic Aneurysm  The various methods of treatment have been discussed with the patient and family. After consideration of risks, benefits and other options for treatment, the patient has consented to  Procedure(s): ABDOMINAL AORTIC ENDOVASCULAR STENT GRAFT (N/A) as a surgical intervention .  The patient's history has been reviewed, patient examined, no change in status, stable for surgery.  I have reviewed the patient's chart and labs.  Questions were answered to the patient's satisfaction.     EARLY, [name]

## 2012-08-29 ENCOUNTER — Telehealth: Payer: Self-pay | Admitting: Vascular Surgery

## 2012-08-29 ENCOUNTER — Other Ambulatory Visit: Payer: Self-pay | Admitting: *Deleted

## 2012-08-29 ENCOUNTER — Encounter (HOSPITAL_COMMUNITY): Payer: Self-pay | Admitting: Vascular Surgery

## 2012-08-29 DIAGNOSIS — Z48812 Encounter for surgical aftercare following surgery on the circulatory system: Secondary | ICD-10-CM

## 2012-08-29 DIAGNOSIS — I714 Abdominal aortic aneurysm, without rupture: Secondary | ICD-10-CM

## 2012-08-29 LAB — BASIC METABOLIC PANEL
BUN: 15 mg/dL (ref 6–23)
CO2: 24 mEq/L (ref 19–32)
Chloride: 102 mEq/L (ref 96–112)
GFR calc Af Amer: 85 mL/min — ABNORMAL LOW (ref >90)
Glucose, Bld: 157 mg/dL — ABNORMAL HIGH (ref 70–99)
Potassium: 4 mEq/L (ref 3.5–5.1)

## 2012-08-29 LAB — CBC
HCT: 39.4 % (ref 39.0–52.0)
Hemoglobin: 14 g/dL (ref 13.0–17.0)
RBC: 4.29 MIL/uL (ref 4.22–5.81)
WBC: 11.6 10*3/uL — ABNORMAL HIGH (ref 4.0–10.5)

## 2012-08-29 MED ORDER — OXYCODONE-ACETAMINOPHEN 5-325 MG PO TABS: 1.0000 | ORAL_TABLET | ORAL | Status: DC | PRN

## 2012-08-29 NOTE — Progress Notes (Signed)
Patient has been discharged home earlier this morning after voiding and having all IV's removed. Reviewed all discharge instructions with patient and wife and answered all concerns and questons

## 2012-08-29 NOTE — Telephone Encounter (Addendum)
Message copied by Shari Prows on Tue Aug 29, 2012  4:47 PM ------      Message from: Sharee Pimple      Created: Tue Aug 29, 2012  9:55 AM      Regarding: schedule                   ----- Message -----         From: Marlowe Shores, PA         Sent: 08/29/2012   8:11 AM           To: Melene Plan, RN, Sharee Pimple, CMA            1 month F/U post EVAR -Early - needs CTA abd/pelvis ------  I scheduled an appt for the above patient for 10/03/12 at 12:15pm w/ TFE and for a cta prior at [facility] at 10:30am. I left a message for the pt [id number] and mailed an appt letter/awt

## 2012-08-29 NOTE — Discharge Summary (Signed)
[facility] Discharge Summary   Patient ID:  Martin Dickerson MRN: [id number] DOB/AGE: 69-28-1947 69 y.o.  Admit date: 08/28/2012 Discharge date: 08/29/2012 Date of Surgery: 08/28/2012 Surgeon: Surgeon(s): Larina Earthly, MD  Admission Diagnosis: AAA  Discharge Diagnoses:  AAA  Secondary Diagnoses: Past Medical History  Diagnosis Date  . ASHD (arteriosclerotic heart disease)   . Obesity   . Hemorrhoids   . AAA (abdominal aortic aneurysm) 2007  . ED (erectile dysfunction)   . Hyperlipidemia     takes Lipitor daily  . SVT (supraventricular tachycardia)   . PONV (postoperative nausea and vomiting)   . Hypertension     takes Metoprolol and HCTZ daily  . Myocardial infarction 2002    has never had to take Nitroglycerin  . Emphysema      a "Touch" per pt  . GERD (gastroesophageal reflux disease)     takes Zantac nightly  . History of colon polyps     Procedure(s): ABDOMINAL AORTIC ENDOVASCULAR STENT GRAFT main body device  was a 28 x 14 x 16 cm length contralateral leg was a 14 x 12 cm  Discharged Condition: good  HPI: Martin Dickerson is a 67 y.o. male with known AAA had CT scan today shows infrarenal abdominal aortic aneurysm with maximal diameter of 5.5 cm. This does extend into the left iliac artery. 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 I do feel that he would be a candidate for stent graft with either a layered limb of the left iliac or preoperative coiling of the internal iliac with extension into the external iliac on the left. 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 Also explained this may make him at some increased risk for persistent endoleak.well as lained that he would have loss of the small area that this artery.   He wishes to procede with stent graft repair  The pt is for cardiac  clearance. Overall Impression: Normal stress nuclear study.  LV Ejection Fraction: 70%. LV Wall Motion: NL LV Function; NL Wall Motion    Hospital Course:  Martin Dickerson is a 67 y.o. male is S/P  Procedure(s): ABDOMINAL AORTIC ENDOVASCULAR STENT GRAFT Extubated: POD # 0 Physical exam: abdomen is soft Both groin wounds soft, mild hematoma on left, minimal drainage from groin wounds Palpable DP pulses bilat. Post-op wounds healing well Pt. Ambulating, voiding and taking PO diet without difficulty. Pt pain controlled with PO pain meds. Labs as below Complications:none  Consults:     Significant Diagnostic Studies: CBC Lab Results  Component Value Date   WBC 11.6* 08/29/2012   HGB 14.0 08/29/2012   HCT 39.4 08/29/2012   MCV 91.8 08/29/2012   PLT 154 08/29/2012    BMET    Component Value Date/Time   NA 136 08/29/2012 0330   K 4.0 08/29/2012 0330   CL 102 08/29/2012 0330   CO2 24 08/29/2012 0330   GLUCOSE 157* 08/29/2012 0330   BUN 15 08/29/2012 0330   CREATININE 1.02 08/29/2012 0330   CREATININE 1.01 07/17/2012 1123   CALCIUM 8.3* 08/29/2012 0330   GFRNONAA 73* 08/29/2012 0330   GFRAA 85* 08/29/2012 0330   COAG Lab Results  Component Value Date   INR 0.98 08/28/2012   INR 0.91 08/23/2012     Disposition:  Discharge to :Home  Discharge Orders   Future Appointments Provider Department Dept Phone   11/06/2012 9:30 AM Vesta Mixer, MD [facility] Main Office [place]) [phone]   11/06/2012 9:45 AM [facility] Main Office [place]) [phone]   Future Orders Complete By Expires     ABDOMINAL PROCEDURE/ANEURYSM REPAIR/AORTO-BIFEMORAL BYPASS:  Call MD for increased abdominal pain; cramping diarrhea; nausea/vomiting  As directed     Call MD for:  redness, tenderness, or signs of infection (pain, swelling, bleeding, redness, odor or green/yellow discharge around incision site)  As directed     Call MD for:  severe or increased pain, loss or decreased feeling  in  affected limb(s)  As directed     Call MD for:  temperature >100.5  As directed     Driving Restrictions  As directed     Comments:      No driving for 2 weeks    Increase activity slowly  As directed     Comments:      Walk with assistance use walker or cane as needed    Lifting restrictions  As directed     Comments:      No lifting for 4 weeks    May shower   As directed     Scheduling Instructions:      Wednesday    Remove dressing in 24 hours  As directed     Resume previous diet  As directed     may wash over wound with mild soap and water  As directed         Medication List    TAKE these medications       aspirin EC 81 MG tablet  Take 81 mg by mouth daily.     atorvastatin 20 MG tablet  Commonly known as:  LIPITOR  Take 20 mg by mouth at bedtime.     hydrochlorothiazide 25 MG tablet  Commonly known as:  HYDRODIURIL  Take 1 tablet (25 mg total) by mouth daily.     metoprolol tartrate 25 MG tablet  Commonly known as:  LOPRESSOR  Take 25 mg by mouth 2 (two) times daily.     nitroGLYCERIN 0.4 MG SL tablet  Commonly known as:  NITROSTAT  Place 0.4 mg under the tongue every 5 (five) minutes as needed for chest pain.     oxyCODONE-acetaminophen 5-325 MG per tablet  Commonly known as:  ROXICET  Take 1 tablet by mouth every 4 (four) hours as needed for pain.     potassium chloride 10 MEQ tablet  Commonly known as:  K-DUR,KLOR-CON  Take 1 tablet (10 mEq total) by mouth daily.     ranitidine 150 MG tablet  Commonly known as:  ZANTAC  Take 150 mg by mouth at bedtime.       Verbal and written Discharge instructions given to the patient. Wound care per Discharge AVS     Follow-up Information   Follow up with EARLY, [name], MD In 4 weeks. (office will arrange - sent)    Contact information:   [address] [place] [address] [phone]       Signed: Marlowe Shores 08/29/2012, 8:23 AM

## 2012-08-29 NOTE — Progress Notes (Addendum)
[facility] [place]  Post-op EVAR Date of Surgery: 08/28/2012 Surgeon: Surgeon(s): Larina Earthly, MD POD: 1 Day Post-Op Endoleak: no evidence of type I endoleak. There was late filling of the sac the lumbar collaterals  History of Present Illness  Martin Dickerson is a 67 y.o. male who is s/p EVAR. The patient denies back pain; denies abdominal pain; denies lower extremity pain.  He is Ambulating and taking PO well without nausea or vomiting. Pt. has not voided with foley out  IMAGING: Dg Chest Portable 1 View  08/28/2012  *RADIOLOGY REPORT*  Clinical Data: Stent graft placement.  PORTABLE CHEST - 1 VIEW  Comparison: Two-view chest 08/23/2012  Findings: The heart size is normal.  The lungs are clear.  Right IJ sheath is in place.  There is no pneumothorax.  Mild emphysematous changes are stable.  IMPRESSION:  1.  No acute cardiopulmonary disease. 2.  New right IJ sheath without radiographic evidence for complication.   Original Report Authenticated By: Marin Roberts, M.D.    Dg Abd Portable 1v  08/28/2012  *RADIOLOGY REPORT*  Clinical Data: Asthma stent graft placement  PORTABLE ABDOMEN - 1 VIEW  Comparison: 08/08/2012  Findings: Contrast material is noted within the clip renal collecting system bilaterally.  Foley catheter has decompressed the bladder.  A aortic stent graft is now seen extending into the common iliac arteries bilaterally.  The stent graft appears intact. A nonobstructive bowel gas pattern is noted.  No acute abnormality is seen.  IMPRESSION: Newly placed aortic stent graft in satisfactory position. Correlation with operative findings is recommended.   Original Report Authenticated By: Alcide Clever, M.D.     Significant Diagnostic Studies: CBC Lab Results  Component Value Date   WBC 11.6* 08/29/2012   HGB 14.0 08/29/2012   HCT 39.4 08/29/2012   MCV 91.8 08/29/2012   PLT 154 08/29/2012     BMET    Component Value Date/Time   NA 136 08/29/2012 0330   K 4.0  08/29/2012 0330   CL 102 08/29/2012 0330   CO2 24 08/29/2012 0330   GLUCOSE 157* 08/29/2012 0330   BUN 15 08/29/2012 0330   CREATININE 1.02 08/29/2012 0330   CREATININE 1.01 07/17/2012 1123   CALCIUM 8.3* 08/29/2012 0330   GFRNONAA 73* 08/29/2012 0330   GFRAA 85* 08/29/2012 0330    COAG Lab Results  Component Value Date   INR 0.98 08/28/2012   INR 0.91 08/23/2012   No results found for this basename: PTT     I/O last 3 completed shifts: In: 4253.8 [P.O.:960; I.V.:3243.8; IV Piggyback:50] Out: 1650 [Urine:1600; Blood:50] No data found.   Physical Examination  BP Readings from Last 3 Encounters:  08/29/12 124/68  08/29/12 124/68  08/23/12 160/107   Temp Readings from Last 3 Encounters:  08/29/12 98.1 F (36.7 C) Oral  08/29/12 98.1 F (36.7 C) Oral  08/23/12 97.8 F (36.6 C) Oral   SpO2 Readings from Last 3 Encounters:  08/29/12 94%  08/29/12 94%  08/23/12 98%   Pulse Readings from Last 3 Encounters:  08/29/12 83  08/29/12 83  08/23/12 82    General: A&O x 3, WDWN male in NAD Gait: Normal Pulmonary: normal non-labored breathing  Cardiac: RRR Abdomen: soft, NT, NABS Bilateral groin wounds: clean, dry, intact, with  Min hematoma on left Min drainage on both dressing Vascular Exam/Pulses: bilat DP palpable  Extremities without ischemic changes, no Gangrene , no cellulitis; no open wounds;   Neurologic: A&O X 3;  Appropriate Affect  Assessment: Martin Dickerson is a 67 y.o. male who is 1 Day Post-Op EVAR.  Pt is doing well with no complaints  Plan: Home  The importance of surveillance of the endograft was discussed with the patient  A CTA of abdomen and pelvis will be scheduled for one month to assess for endoleak.  The patient will follow up with [place] in one month with these studies.   SignedMarlowe Shores [phone] 08/29/2012 8:19 AM.   I have examined the patient, reviewed and agree with above.  EARLY, [name], MD 08/29/2012 8:39 AM

## 2012-10-02 ENCOUNTER — Encounter: Payer: Self-pay | Admitting: Vascular Surgery

## 2012-10-03 ENCOUNTER — Encounter: Payer: Self-pay | Admitting: Vascular Surgery

## 2012-10-03 ENCOUNTER — Ambulatory Visit
Admission: RE | Admit: 2012-10-03 | Discharge: 2012-10-03 | Disposition: A | Discharge status: Home / Self Care | Payer: Medicare HMO | Source: Ambulatory Visit | Attending: Vascular Surgery | Admitting: Vascular Surgery

## 2012-10-03 ENCOUNTER — Ambulatory Visit (INDEPENDENT_AMBULATORY_CARE_PROVIDER_SITE_OTHER): Payer: Medicare HMO | Admitting: Vascular Surgery

## 2012-10-03 VITALS — BP 124/74 | HR 77 | Temp 97.9°F | Resp 16 | Ht 72.0 in | Wt 240.0 lb

## 2012-10-03 DIAGNOSIS — I714 Abdominal aortic aneurysm, without rupture, unspecified: Secondary | ICD-10-CM

## 2012-10-03 DIAGNOSIS — Z8679 Personal history of other diseases of the circulatory system: Secondary | ICD-10-CM | POA: Insufficient documentation

## 2012-10-03 DIAGNOSIS — Z48812 Encounter for surgical aftercare following surgery on the circulatory system: Secondary | ICD-10-CM

## 2012-10-03 MED ORDER — IOHEXOL 350 MG/ML SOLN
75.0000 mL | Freq: Once | INTRAVENOUS | Status: AC | PRN
  Administered 2012-10-03: 75 mL via INTRAVENOUS

## 2012-10-03 NOTE — Progress Notes (Signed)
[facility] [place]  Established EVAR  History of Present Illness  Martin Dickerson is a 67 y.o. (1945-09-06) male who presents for routine follow up s/p EVAR (Date: 08-28-2012).   Most recent CT (Date: 10-03-2012) demonstrates: type 2 endoleak and 5.5 sac size.  The patient has not had back or abdominal pain.  Past Medical History, Past Surgical History, Social History, Family History, Medications, Allergies, and Review of Systems are unchanged from previous evaluation on .  Physical Examination  Filed Vitals:   10/03/12 1225  BP: 124/74  Pulse: 77  Temp: 97.9 F (36.6 C)  TempSrc: Oral  Resp: 16  Height: 6' (1.829 m)  Weight: 240 lb (108.863 kg)  SpO2: 99%   Body mass index is 32.54 kg/(m^2).  General: A&O x 3, WDWN.  Eyes: PER  Pulmonary:Normal breathing   Vascular:Femoral , DP, PT pulses intact.  No visible pulsatile mass in the abdomin. Gastrointestinal: soft, NTND.  Musculoskeletal: M/S 5/5 throughouT   Non-Invasive Vascular Imaging  EVAR CT W/CO (Date: 10-03-2012)  AAA sac size: 5.5  cm   Type 2  endoleak detected  Medical Decision Making  Martin Dickerson is a 67 y.o. male who presents s/p EVAR.  Pt is asymptomatic with 5.5 cm sac size.  I discussed with the patient the importance of surveillance of the endograft.  The next CT will be scheduled for 6 months.  The patient will follow up with [place] in 6 months with these studies.  Thank you for allowing [place] to participate in this patient's care.  The patient was seen by Dr. Fayne Mediate, Oddie Kuhlmann [facility]  [facility] [place] Office: [phone] Pager: [phone]  10/03/2012, 12:54 PM      I have examined the patient, reviewed and agree with above. Excellent early treatment of aneurysm with stent graft in. I explained his type II endoleak and that this is typically benign. He will continue blockages that limitation see [place] in 6 months with repeat CT scan EARLY,  [name], MD 10/03/2012 1:08 PM

## 2012-10-03 NOTE — Addendum Note (Signed)
Addended by: Dannielle Karvonen on: 10/03/2012 02:13 PM   Modules accepted: Orders

## 2012-10-16 ENCOUNTER — Other Ambulatory Visit: Payer: Self-pay | Admitting: Family Medicine

## 2012-11-06 ENCOUNTER — Encounter: Payer: Self-pay | Admitting: Cardiovascular Disease

## 2012-11-06 ENCOUNTER — Other Ambulatory Visit: Payer: Medicare HMO

## 2012-11-06 ENCOUNTER — Ambulatory Visit (INDEPENDENT_AMBULATORY_CARE_PROVIDER_SITE_OTHER): Payer: Medicare HMO | Admitting: Cardiovascular Disease

## 2012-11-06 VITALS — BP 128/88 | HR 76 | Ht 72.0 in | Wt 246.0 lb

## 2012-11-06 DIAGNOSIS — I714 Abdominal aortic aneurysm, without rupture, unspecified: Secondary | ICD-10-CM

## 2012-11-06 DIAGNOSIS — E785 Hyperlipidemia, unspecified: Secondary | ICD-10-CM

## 2012-11-06 DIAGNOSIS — I2510 Atherosclerotic heart disease of native coronary artery without angina pectoris: Secondary | ICD-10-CM

## 2012-11-06 DIAGNOSIS — I10 Essential (primary) hypertension: Secondary | ICD-10-CM

## 2012-11-06 NOTE — Progress Notes (Signed)
Martin Dickerson Date of Birth  08-27-1945       [facility] Office [address], [address]  [address] [place], [place]  [address]   [place], [place]  [address] [phone]     [phone]   Fax  [phone]    Fax [phone]  Problem List: 1. Coronary artery disease-status post coronary artery bypass grafting, 2003 2. Abdominal aortic aneurysm 3. Hypertension 4. Hyperlipidemia 5. Supraventricular tachycardia  History of Present Illness:  Martin Dickerson is a 67 yo gentleman with a hx of CABG in 2002 or 2003.  He was last seen by me several years ago but we do not have that chart.  He was recently found to have an enlarging abdominal aortic aneurysm (5.3 cm). He has been seen by Dr. Arbie Cookey and needs to have repair of his abdominal aortic aneurysm.  He denies any episodes of chest pain or shortness of breath.  Nov 06, 2012:  After his last visit we scheduled a stress Cardiolite study. The Cardiolite study was found to be normal. There is no ischemia. His left ventricular systolic function was normal.  He has had a Gore Stent graft repair of his 5.5 cm AAA.  He is doing well.    Current Outpatient Prescriptions on File Prior to Visit  Medication Sig Dispense Refill  . aspirin EC 81 MG tablet Take 81 mg by mouth daily.        Marland Kitchen atorvastatin (LIPITOR) 20 MG tablet Take 20 mg by mouth at bedtime.      . hydrochlorothiazide (HYDRODIURIL) 25 MG tablet Take 1 tablet (25 mg total) by mouth daily.  90 tablet  1  . metoprolol tartrate (LOPRESSOR) 25 MG tablet Take 25 mg by mouth 2 (two) times daily.      . nitroGLYCERIN (NITROSTAT) 0.4 MG SL tablet Place 0.4 mg under the tongue every 5 (five) minutes as needed for chest pain.      Marland Kitchen oxyCODONE-acetaminophen (ROXICET) 5-325 MG per tablet Take 1 tablet by mouth every 4 (four) hours as needed for pain.  30 tablet  0  . potassium chloride (K-DUR,KLOR-CON) 10 MEQ tablet Take 1 tablet (10 mEq total) by  mouth daily.  90 tablet  1  . ranitidine (ZANTAC) 150 MG tablet TAKE 1 TABLET AT BEDTIME  90 tablet  PRN   No current facility-administered medications on file prior to visit.    Allergies  Allergen Reactions  . Lipitor (Atorvastatin Calcium)     MUSCLE ACHES    Past Medical History  Diagnosis Date  . ASHD (arteriosclerotic heart disease)   . Obesity   . Hemorrhoids   . AAA (abdominal aortic aneurysm) 2007  . ED (erectile dysfunction)   . Hyperlipidemia     takes Lipitor daily  . SVT (supraventricular tachycardia)   . PONV (postoperative nausea and vomiting)   . Hypertension     takes Metoprolol and HCTZ daily  . Myocardial infarction 2002    has never had to take Nitroglycerin  . Emphysema      a "Touch" per pt  . GERD (gastroesophageal reflux disease)     takes Zantac nightly  . History of colon polyps     Past Surgical History  Procedure Laterality Date  . Ercp    . US echocardiography  10/26/2005    EF 55-60%  . Cardiovascular stress test  01/31/2007    EF 68% NO EVIDENCE OF ISCHEMIA  .  Coronary artery bypass graft  02/2001    quadruple  . Cardiac catheterization  03/22/2001    EF 40%  . Colonoscopy    . Abdominal aortic endovascular stent graft N/A 08/28/2012    Procedure: ABDOMINAL AORTIC ENDOVASCULAR STENT GRAFT;  Surgeon: Larina Earthly, MD;  Location: [facility] OR;  Service: Vascular;  Laterality: N/A;  Ultrasound guided    History  Smoking status  . Former Smoker -- 35 years  . Types: Cigarettes  . Quit date: 06/28/2000  Smokeless tobacco  . Never Used    Comment: quit in 2002    History  Alcohol Use No    Family History  Problem Relation Age of Onset  . Stroke Mother   . Hypertension Father   . Heart disease Maternal Uncle     Reviw of Systems:  Reviewed in the HPI.  All other systems are negative.  Physical Exam: Blood pressure 128/88, pulse 76, height 6' (1.829 m), weight 246 lb (111.585 kg), SpO2 93.00%. General: Well developed, well nourished,  in no acute distress.  Head: Normocephalic, atraumatic, sclera non-icteric, mucus membranes are moist,   Neck: Supple. Carotids are 2 + without bruits. No JVD   Lungs: Clear   Heart: RR, normal S1, S2  Abdomen: Soft, non-tender, non-distended with normal bowel sounds.  Msk:  Strength and tone are normal   Extremities: No clubbing or cyanosis. No edema.  Distal pedal pulses are 2+ and equal    Neuro: CN II - XII intact.  Alert and oriented X 3.   Psych:  Normal   ECG: July 17, 2012: Normal sinus rhythm. Has left axis deviation. He has no ST or T wave changes.  Assessment / Plan:

## 2012-11-06 NOTE — Patient Instructions (Addendum)
LAB/ BMET CANCELLED FOR TODAY, PT HAD LABS IN HOSPITAL  Your physician wants you to follow-up in: 6 MONTHS WITH EKG You will receive a reminder letter in the mail two months in advance. If you don't receive a letter, please call our office to schedule the follow-up appointment.  Your physician recommends that you return for lab work in: 6 MONTHS FASTING LABS/ DRINK WATER PLEASE

## 2012-11-06 NOTE — Assessment & Plan Note (Signed)
Well controlled 

## 2012-11-06 NOTE — Assessment & Plan Note (Signed)
Martin Dickerson is doing well. We'll continue with the same medications. His vital signs are stable. He's not having any angina. His last Myoview study was normal.

## 2012-11-06 NOTE — Assessment & Plan Note (Signed)
Will check lipids, BMP, HFP at his next visit in 6 months.

## 2013-01-11 ENCOUNTER — Other Ambulatory Visit: Payer: Self-pay | Admitting: Cardiovascular Disease

## 2013-01-31 ENCOUNTER — Other Ambulatory Visit: Payer: Self-pay

## 2013-04-16 ENCOUNTER — Encounter: Payer: Self-pay | Admitting: Vascular Surgery

## 2013-04-16 ENCOUNTER — Other Ambulatory Visit: Payer: Self-pay | Admitting: Vascular Surgery

## 2013-04-17 ENCOUNTER — Ambulatory Visit (INDEPENDENT_AMBULATORY_CARE_PROVIDER_SITE_OTHER): Payer: Medicare HMO | Admitting: Vascular Surgery

## 2013-04-17 ENCOUNTER — Ambulatory Visit
Admission: RE | Admit: 2013-04-17 | Discharge: 2013-04-17 | Disposition: A | Discharge status: Home / Self Care | Payer: Commercial Managed Care - HMO | Source: Ambulatory Visit | Attending: Vascular Surgery | Admitting: Vascular Surgery

## 2013-04-17 ENCOUNTER — Encounter: Payer: Self-pay | Admitting: Vascular Surgery

## 2013-04-17 VITALS — BP 154/87 | HR 58 | Ht 72.0 in | Wt 216.4 lb

## 2013-04-17 DIAGNOSIS — I714 Abdominal aortic aneurysm, without rupture: Secondary | ICD-10-CM

## 2013-04-17 DIAGNOSIS — Z48812 Encounter for surgical aftercare following surgery on the circulatory system: Secondary | ICD-10-CM

## 2013-04-17 MED ORDER — IOHEXOL 350 MG/ML SOLN
75.0000 mL | Freq: Once | INTRAVENOUS | Status: AC | PRN
  Administered 2013-04-17: 75 mL via INTRAVENOUS

## 2013-04-17 NOTE — Addendum Note (Signed)
Addended by: Adria Dill L on: 04/17/2013 04:01 PM   Modules accepted: Orders

## 2013-04-17 NOTE — Addendum Note (Signed)
Addended by: Adria Dill L on: 04/17/2013 12:50 PM   Modules accepted: Orders

## 2013-04-17 NOTE — Progress Notes (Signed)
The patient is a for followup of stent graft repair of an aortic aneurysm on 08/28/2012. He is doing quite well. He reports he feels better than he has in last 20 years. He has been on active diet without weight loss. He denies any symptoms referable to his aneurysm or lower trim the arterial insufficiency  Past Medical History  Diagnosis Date  . ASHD (arteriosclerotic heart disease)   . Obesity   . Hemorrhoids   . AAA (abdominal aortic aneurysm) 2007  . ED (erectile dysfunction)   . Hyperlipidemia     takes Lipitor daily  . SVT (supraventricular tachycardia)   . PONV (postoperative nausea and vomiting)   . Hypertension     takes Metoprolol and HCTZ daily  . Myocardial infarction 2002    has never had to take Nitroglycerin  . Emphysema      a "Touch" per pt  . GERD (gastroesophageal reflux disease)     takes Zantac nightly  . History of colon polyps     History  Substance Use Topics  . Smoking status: Former Smoker -- 35 years    Types: Cigarettes    Quit date: 06/28/2000  . Smokeless tobacco: Never Used     Comment: quit in 2002  . Alcohol Use: No    Family History  Problem Relation Age of Onset  . Stroke Mother   . Hypertension Father   . Heart disease Maternal Uncle     Allergies  Allergen Reactions  . Lipitor [Atorvastatin Calcium]     MUSCLE ACHES    Current outpatient prescriptions:aspirin EC 81 MG tablet, Take 81 mg by mouth daily.  , Disp: , Rfl: ;  atorvastatin (LIPITOR) 20 MG tablet, Take 20 mg by mouth at bedtime., Disp: , Rfl: ;  hydrochlorothiazide (HYDRODIURIL) 25 MG tablet, TAKE 1 TABLET EVERY DAY, Disp: 90 tablet, Rfl: 1;  metoprolol tartrate (LOPRESSOR) 25 MG tablet, Take 25 mg by mouth 2 (two) times daily., Disp: , Rfl:  nitroGLYCERIN (NITROSTAT) 0.4 MG SL tablet, Place 0.4 mg under the tongue every 5 (five) minutes as needed for chest pain., Disp: , Rfl: ;  potassium chloride (K-DUR,KLOR-CON) 10 MEQ tablet, TAKE 1 TABLET EVERY DAY, Disp: 90 tablet,  Rfl: 1;  ranitidine (ZANTAC) 150 MG tablet, TAKE 1 TABLET AT BEDTIME, Disp: 90 tablet, Rfl: PRN oxyCODONE-acetaminophen (ROXICET) 5-325 MG per tablet, Take 1 tablet by mouth every 4 (four) hours as needed for pain., Disp: 30 tablet, Rfl: 0  BP 154/87  Pulse 58  Ht 6' (1.829 m)  Wt 216 lb 6.4 oz (98.158 kg)  BMI 29.34 kg/m2  SpO2 100%  Body mass index is 29.34 kg/(m^2).       Physical exam well-developed well-nourished gentleman in no acute distress Neurological he is grossly Pulse status 2+ radial 2+ femoral 2+ popliteal 2+ posterior tibial pulses with no evidence of peripheral artery aneurysm Respirations are equal nonlabored Abdomen soft nontender he does have palpable aneurysm with slight pulse.  CT scan today of his aorta was reviewed with the patient. This shows good positioning of the stent graft and no change in his aneurysm size of 5.6 cm maximal diameter. 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  Impression and plan stable status post stent graft repair, aortic aneurysm. I discussed this at length the patient. Explained type II endoleak that this was not a concern. We will see him in 6 months with repeat CT scan in drop back to  yearly following this.

## 2013-05-03 ENCOUNTER — Other Ambulatory Visit: Payer: Self-pay | Admitting: Family Medicine

## 2013-05-03 ENCOUNTER — Other Ambulatory Visit: Payer: Self-pay

## 2013-06-07 ENCOUNTER — Other Ambulatory Visit: Payer: Self-pay | Admitting: Family Medicine

## 2013-07-15 ENCOUNTER — Other Ambulatory Visit: Payer: Self-pay | Admitting: Cardiovascular Disease

## 2013-07-15 ENCOUNTER — Other Ambulatory Visit: Payer: Self-pay | Admitting: Family Medicine

## 2013-07-19 ENCOUNTER — Encounter: Payer: Self-pay | Admitting: Family Medicine

## 2013-07-19 ENCOUNTER — Ambulatory Visit (INDEPENDENT_AMBULATORY_CARE_PROVIDER_SITE_OTHER): Payer: Medicare HMO | Admitting: Family Medicine

## 2013-07-19 VITALS — BP 124/80 | HR 109 | Ht 72.0 in | Wt 203.0 lb

## 2013-07-19 DIAGNOSIS — I714 Abdominal aortic aneurysm, without rupture, unspecified: Secondary | ICD-10-CM

## 2013-07-19 DIAGNOSIS — K219 Gastro-esophageal reflux disease without esophagitis: Secondary | ICD-10-CM

## 2013-07-19 DIAGNOSIS — E785 Hyperlipidemia, unspecified: Secondary | ICD-10-CM

## 2013-07-19 DIAGNOSIS — Z23 Encounter for immunization: Secondary | ICD-10-CM

## 2013-07-19 DIAGNOSIS — I10 Essential (primary) hypertension: Secondary | ICD-10-CM

## 2013-07-19 DIAGNOSIS — Z79899 Other long term (current) drug therapy: Secondary | ICD-10-CM

## 2013-07-19 DIAGNOSIS — Z0000 Encounter for general adult medical examination without abnormal findings: Secondary | ICD-10-CM

## 2013-07-19 DIAGNOSIS — N529 Male erectile dysfunction, unspecified: Secondary | ICD-10-CM

## 2013-07-19 DIAGNOSIS — I2510 Atherosclerotic heart disease of native coronary artery without angina pectoris: Secondary | ICD-10-CM

## 2013-07-19 LAB — LIPID PANEL
CHOL/HDL RATIO: 2.8 ratio
Cholesterol: 141 mg/dL (ref 0–200)
HDL: 50 mg/dL (ref >39)
LDL Cholesterol: 76 mg/dL (ref 0–99)
Triglycerides: 73 mg/dL (ref <150)
VLDL: 15 mg/dL (ref 0–40)

## 2013-07-19 LAB — CBC WITH DIFFERENTIAL/PLATELET
BASOS ABS: 0 10*3/uL (ref 0.0–0.1)
Basophils Relative: 0 % (ref 0–1)
EOS ABS: 0.1 10*3/uL (ref 0.0–0.7)
EOS PCT: 2 % (ref 0–5)
HCT: 47.2 % (ref 39.0–52.0)
Hemoglobin: 16.3 g/dL (ref 13.0–17.0)
LYMPHS ABS: 2.3 10*3/uL (ref 0.7–4.0)
Lymphocytes Relative: 40 % (ref 12–46)
MCH: 32.1 pg (ref 26.0–34.0)
MCHC: 34.5 g/dL (ref 30.0–36.0)
MCV: 92.9 fL (ref 78.0–100.0)
Monocytes Absolute: 0.5 10*3/uL (ref 0.1–1.0)
Monocytes Relative: 9 % (ref 3–12)
NEUTROS PCT: 49 % (ref 43–77)
Neutro Abs: 2.9 10*3/uL (ref 1.7–7.7)
Platelets: 161 10*3/uL (ref 150–400)
RBC: 5.08 MIL/uL (ref 4.22–5.81)
RDW: 14.5 % (ref 11.5–15.5)
WBC: 5.9 10*3/uL (ref 4.0–10.5)

## 2013-07-19 LAB — COMPREHENSIVE METABOLIC PANEL
ALT: 22 U/L (ref 0–53)
AST: 23 U/L (ref 0–37)
Albumin: 4.2 g/dL (ref 3.5–5.2)
Alkaline Phosphatase: 113 U/L (ref 39–117)
BUN: 13 mg/dL (ref 6–23)
CO2: 30 mEq/L (ref 19–32)
CREATININE: 0.95 mg/dL (ref 0.50–1.35)
Calcium: 9.5 mg/dL (ref 8.4–10.5)
Chloride: 101 mEq/L (ref 96–112)
Glucose, Bld: 91 mg/dL (ref 70–99)
Potassium: 5 mEq/L (ref 3.5–5.3)
Sodium: 139 mEq/L (ref 135–145)
Total Bilirubin: 1.2 mg/dL (ref 0.3–1.2)
Total Protein: 7.1 g/dL (ref 6.0–8.3)

## 2013-07-19 MED ORDER — RANITIDINE HCL 150 MG PO TABS: ORAL_TABLET | ORAL | Status: DC

## 2013-07-19 NOTE — Progress Notes (Signed)
   Subjective:    Patient ID: Martin Dickerson, male    DOB: 1945-09-22, 68 y.o.   MRN: [id number]  HPI He is here for complete examination. He does have a history of AAA with repair. He also has underlying coronary artery disease and does followup with cardiology on a regular basis. He continues on his blood pressure and cholesterol medications. He does have ED and presently is on no medications. He walks every day. He has made some dietary changes. He also has underlying reflux disease and uses Zantac as needed. Family and social history were reviewed. He exercises regularly. He is married.   Review of Systems  Constitutional: Negative.   HENT: Negative.   Eyes: Negative.   Respiratory: Negative.   Cardiovascular: Negative.   Endocrine: Negative.   Genitourinary: Negative.   Musculoskeletal: Negative.   Allergic/Immunologic: Negative.   Neurological: Negative.   Hematological: Negative.        Objective:   Physical Exam BP 124/80  Pulse 109  Ht 6' (1.829 m)  Wt 203 lb (92.08 kg)  BMI 27.53 kg/m2  SpO2 98%  General Appearance:    Alert, cooperative, no distress, appears stated age  Head:    Normocephalic, without obvious abnormality, atraumatic  Eyes:    PERRL, conjunctiva/corneas clear, EOM's intact, fundi    benign  Ears:    Normal TM's and external ear canals  Nose:   Nares normal, mucosa normal, no drainage or sinus   tenderness  Throat:   Lips, mucosa, and tongue normal; teeth and gums normal  Neck:   Supple, no lymphadenopathy;  thyroid:  no   enlargement/tenderness/nodules; no carotid   bruit or JVD  Back:    Spine nontender, no curvature, ROM normal, no CVA     tenderness  Lungs:     Clear to auscultation bilaterally without wheezes, rales or     ronchi; respirations unlabored  Chest Wall:    No tenderness or deformity   Heart:    Regular rate and rhythm, S1 and S2 normal, no murmur, rub   or gallop  Breast Exam:    No chest wall tenderness, masses or gynecomastia    Abdomen:     Soft, non-tender, nondistended, normoactive bowel sounds,    no masses, no hepatosplenomegaly        Extremities:   No clubbing, cyanosis or edema  Pulses:   2+ and symmetric all extremities  Skin:   Skin color, texture, turgor normal, no rashes or lesions  Lymph nodes:   Cervical, supraclavicular, and axillary nodes normal  Neurologic:   CNII-XII intact, normal strength, sensation and gait; reflexes 2+ and symmetric throughout          Psych:   Normal mood, affect, hygiene and grooming.          Assessment & Plan:  Routine general medical examination at a health care facility - Plan: Pneumococcal conjugate vaccine 13-valent  AAA (abdominal aortic aneurysm) without rupture  ASHD (arteriosclerotic heart disease)  Hyperlipidemia LDL goal <70  Hypertension  ED (erectile dysfunction)  GERD (gastroesophageal reflux disease)  his immunizations were updated. Encouraged him to remain physically active.

## 2013-07-23 ENCOUNTER — Encounter: Payer: Self-pay | Admitting: Family Medicine

## 2013-08-07 ENCOUNTER — Other Ambulatory Visit (INDEPENDENT_AMBULATORY_CARE_PROVIDER_SITE_OTHER): Payer: Medicare HMO

## 2013-08-07 DIAGNOSIS — Z1211 Encounter for screening for malignant neoplasm of colon: Secondary | ICD-10-CM

## 2013-08-07 LAB — HEMOCCULT GUIAC POC 1CARD (OFFICE)
Card #2 Fecal Occult Blod, POC: NEGATIVE
Card #3 Fecal Occult Blood, POC: NEGATIVE
FECAL OCCULT BLD: NEGATIVE

## 2013-10-22 ENCOUNTER — Other Ambulatory Visit: Payer: Self-pay | Admitting: Vascular Surgery

## 2013-10-22 ENCOUNTER — Encounter: Payer: Self-pay | Admitting: Vascular Surgery

## 2013-10-22 LAB — CREATININE, SERUM: CREATININE: 0.98 mg/dL (ref 0.50–1.35)

## 2013-10-22 LAB — BUN: BUN: 12 mg/dL (ref 6–23)

## 2013-10-23 ENCOUNTER — Telehealth: Payer: Self-pay | Admitting: Family Medicine

## 2013-10-23 ENCOUNTER — Encounter: Payer: Self-pay | Admitting: Vascular Surgery

## 2013-10-23 ENCOUNTER — Ambulatory Visit (INDEPENDENT_AMBULATORY_CARE_PROVIDER_SITE_OTHER): Payer: Commercial Managed Care - HMO | Admitting: Vascular Surgery

## 2013-10-23 ENCOUNTER — Ambulatory Visit
Admission: RE | Admit: 2013-10-23 | Discharge: 2013-10-23 | Disposition: A | Discharge status: Home / Self Care | Payer: Commercial Managed Care - HMO | Source: Ambulatory Visit | Attending: Vascular Surgery | Admitting: Vascular Surgery

## 2013-10-23 VITALS — BP 124/83 | HR 57 | Ht 72.0 in | Wt 208.0 lb

## 2013-10-23 DIAGNOSIS — I714 Abdominal aortic aneurysm, without rupture, unspecified: Secondary | ICD-10-CM

## 2013-10-23 DIAGNOSIS — Z48812 Encounter for surgical aftercare following surgery on the circulatory system: Secondary | ICD-10-CM

## 2013-10-23 MED ORDER — NITROGLYCERIN 0.4 MG SL SUBL: SUBLINGUAL_TABLET | SUBLINGUAL | Status: DC

## 2013-10-23 MED ORDER — IOHEXOL 350 MG/ML SOLN
80.0000 mL | Freq: Once | INTRAVENOUS | Status: AC | PRN
  Administered 2013-10-23: 80 mL via INTRAVENOUS

## 2013-10-23 NOTE — Addendum Note (Signed)
Addended by: Mena Goes on: 10/23/2013 01:27 PM   Modules accepted: Orders

## 2013-10-23 NOTE — Progress Notes (Signed)
Today for followup stent graft repair of infrarenal abdominal aortic aneurysm on 08/28/2012. He continues to quite well. He is continuing to have weight loss eye with walking program and diet. Again reports he feels better than he has in 20 years. No new major medical difficulties  Past Medical History  Diagnosis Date  . ASHD (arteriosclerotic heart disease)   . Obesity   . Hemorrhoids   . AAA (abdominal aortic aneurysm) 2007  . ED (erectile dysfunction)   . Hyperlipidemia     takes Lipitor daily  . SVT (supraventricular tachycardia)   . PONV (postoperative nausea and vomiting)   . Hypertension     takes Metoprolol and HCTZ daily  . Myocardial infarction 2002    has never had to take Nitroglycerin  . Emphysema      a "Touch" per pt  . GERD (gastroesophageal reflux disease)     takes Zantac nightly  . History of colon polyps     History  Substance Use Topics  . Smoking status: Former Smoker -- 35 years    Types: Cigarettes    Quit date: 06/28/2000  . Smokeless tobacco: Never Used     Comment: quit in 2002  . Alcohol Use: No    Family History  Problem Relation Age of Onset  . Stroke Mother   . Hypertension Father   . Heart disease Maternal Uncle     Allergies  Allergen Reactions  . Lipitor [Atorvastatin Calcium]     MUSCLE ACHES    Current outpatient prescriptions:aspirin EC 81 MG tablet, Take 81 mg by mouth daily.  , Disp: , Rfl: ;  hydrochlorothiazide (HYDRODIURIL) 25 MG tablet, TAKE 1 TABLET EVERY DAY, Disp: 90 tablet, Rfl: 1;  metoprolol tartrate (LOPRESSOR) 25 MG tablet, TAKE 1 TABLET TWICE DAILY, Disp: 180 tablet, Rfl: 3;  NITROSTAT 0.4 MG SL tablet, DISSOLVE 1 TABLET UNDER THE TONGUE EVERY 5 MINUTES AS NEEDED, Disp: 75 tablet, Rfl: 0 potassium chloride (K-DUR,KLOR-CON) 10 MEQ tablet, TAKE 1 TABLET EVERY DAY, Disp: 90 tablet, Rfl: 1;  ranitidine (ZANTAC) 150 MG tablet, TAKE 1 TABLET AT BEDTIME, Disp: 90 tablet, Rfl: 3  BP 124/83  Pulse 57  Ht 6' (1.829 m)  Wt  208 lb (94.348 kg)  BMI 28.20 kg/m2  SpO2 99%  Body mass index is 28.2 kg/(m^2).       Physical exam: Well-developed well-nourished gentleman in no acute distress Grossly intact neurologically 2+ radial 2+ femoral 2+ popliteal 2+ dorsalis pedis pulses bilaterally Abdomen soft nontender no masses noted no aneurysm palpable  Did undergo CT scan today of his abdomen and pelvis for stent graft repair followup. I did discuss this with the patient compared to his initial study and that study from 6 months ago. Maximal size of his aneurysm today his predicted at 5.7. Initially his maximal diameter soft prior treatment was 5.5. He does not have any migration of the stent graft components and does not have any evidence of a type I endoleak. He does have a persistent type II endoleak through his patent IMA and is patent lumbar vessels.  Impression and plan stable status post stent graft repair of infrarenal abdominal aortic aneurysm one year ago. I did discuss the significance of his type II endo-leaks and is a non-her aggression in size of the sac. I felt comfortable looking at this again in one year. I explained that this persistent endoleak rarely causes any difficulty. We will see him again in one year for continued followup

## 2013-10-23 NOTE — Telephone Encounter (Signed)
Nitrostat renewed

## 2013-10-25 ENCOUNTER — Telehealth: Payer: Self-pay | Admitting: Family Medicine

## 2013-10-25 MED ORDER — NITROGLYCERIN 0.4 MG SL SUBL: SUBLINGUAL_TABLET | SUBLINGUAL | Status: DC

## 2013-10-25 NOTE — Telephone Encounter (Signed)
Sent med to [facility]

## 2014-01-08 ENCOUNTER — Other Ambulatory Visit: Payer: Self-pay | Admitting: Cardiovascular Disease

## 2014-01-10 ENCOUNTER — Other Ambulatory Visit: Payer: Self-pay | Admitting: Cardiovascular Disease

## 2014-04-04 ENCOUNTER — Other Ambulatory Visit: Payer: Self-pay | Admitting: Cardiovascular Disease

## 2014-04-13 ENCOUNTER — Other Ambulatory Visit: Payer: Self-pay | Admitting: Cardiovascular Disease

## 2014-04-13 NOTE — Telephone Encounter (Signed)
Rx was sent to pharmacy electronically. 

## 2014-05-06 IMAGING — CT CT CTA ABD/PEL W/CM AND/OR W/O CM
1 of 7 series · 12 of 46 positions shown, 17 images · IV contrast (80CC OMNI 350)
Comparison: CT 11/11/2008

CLINICAL DATA: Abdominal aortic aneurysm and pre stent evaluation.

CT ANGIOGRAPHY ABDOMEN AND PELVIS
TECHNIQUE: Multidetector CT imaging of the abdomen and pelvis was
performed using the standard protocol during bolus administration
of intravenous contrast.  Multiplanar reconstructed images
including MIPs were obtained and reviewed to evaluate the vascular
anatomy.
Contrast: 80mL OMNIPAQUE IOHEXOL 350 MG/ML SOLN

[Series 4: angio · axial · 0.82mm/px · z∈[-414,-4]mm · 12 of 192 slices shown, 17 images]
[im 14/192  soft-tissue]
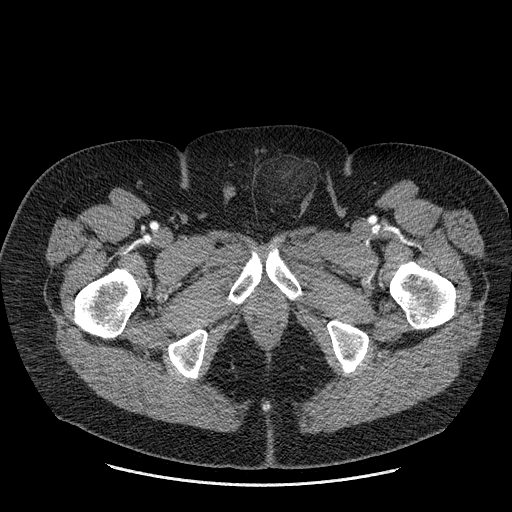
[im 14/192  bone]
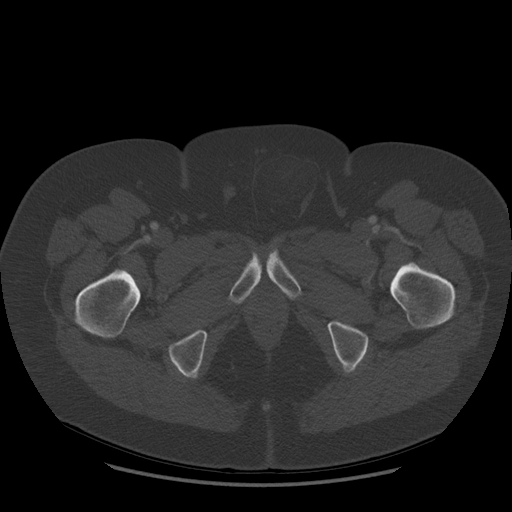
[im 28/192  soft-tissue]
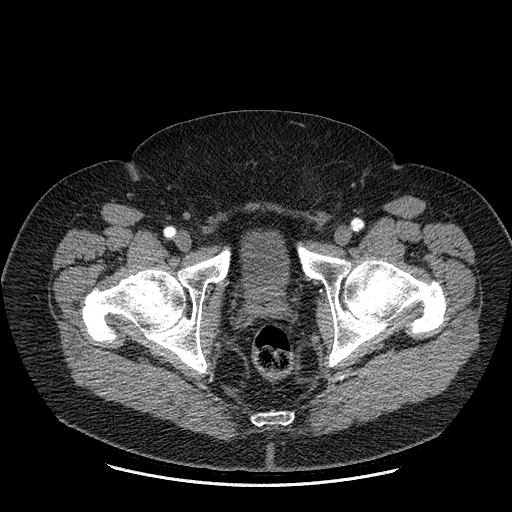
[im 41/192  soft-tissue]
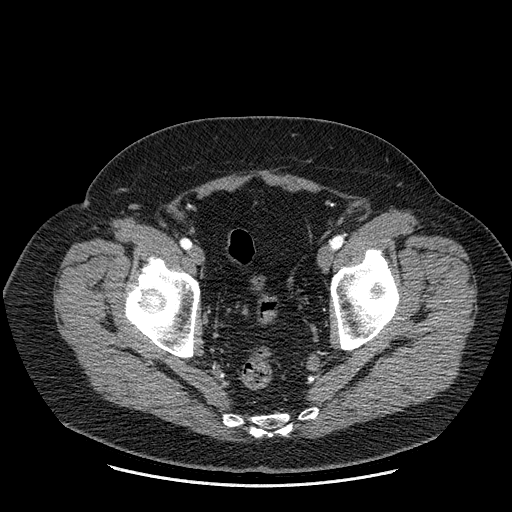
[im 69/192  soft-tissue]
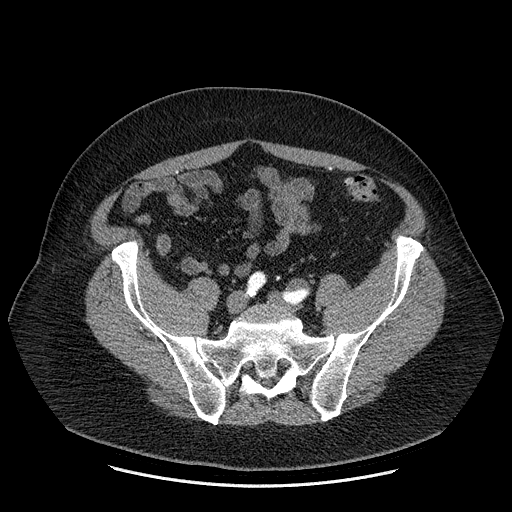
[im 82/192  soft-tissue]
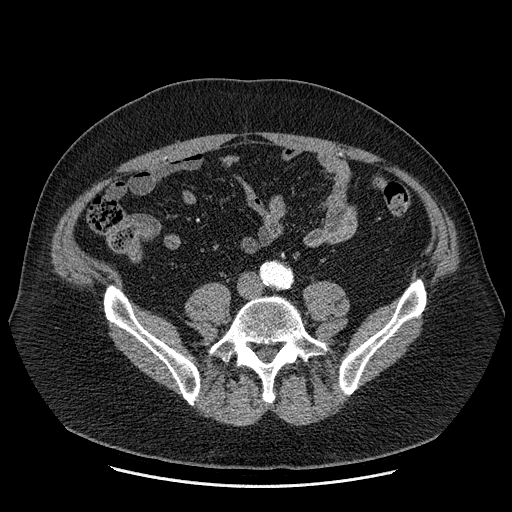
[im 96/192  soft-tissue]
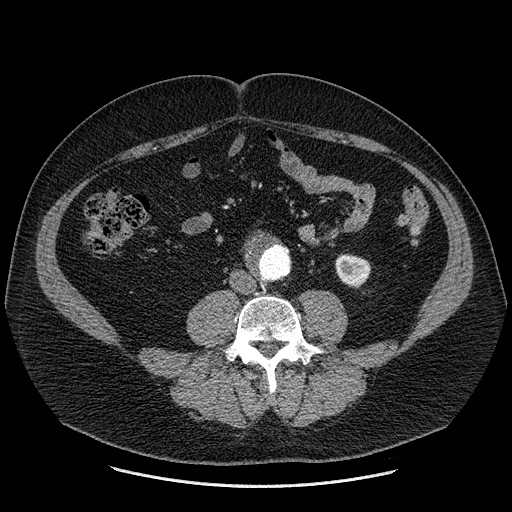
[im 110/192  soft-tissue]
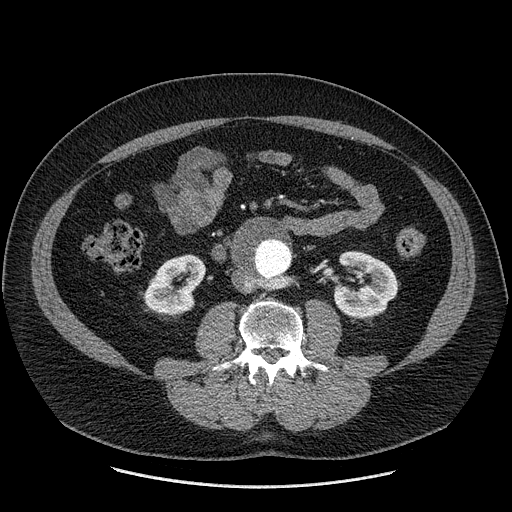
[im 123/192  soft-tissue]
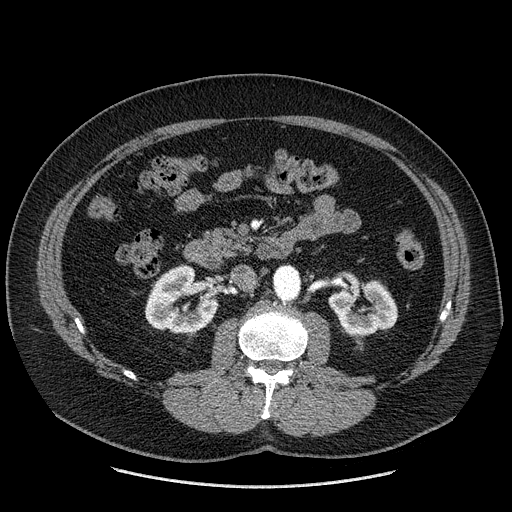
[im 137/192  lung]
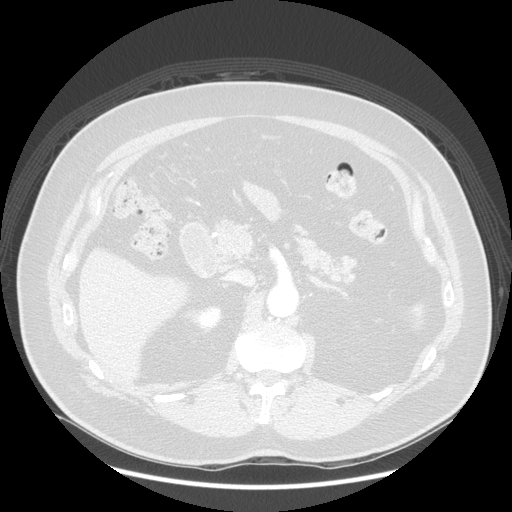
[im 151/192  soft-tissue]
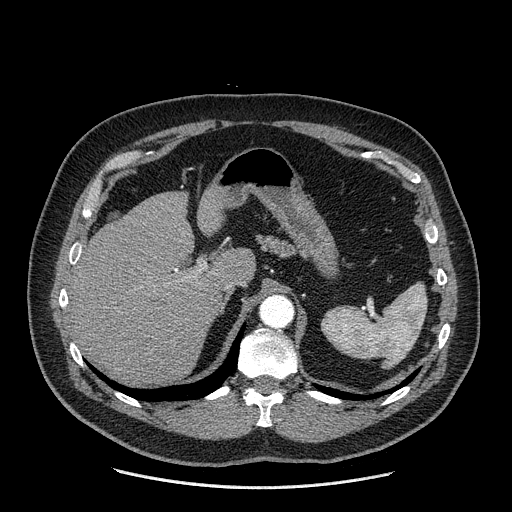
[im 151/192  lung]
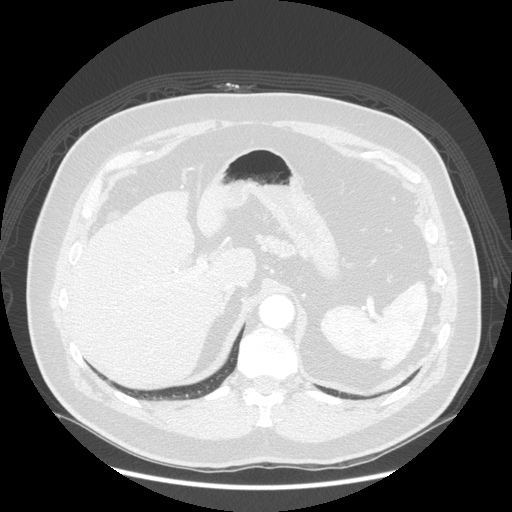
[im 151/192  bone]
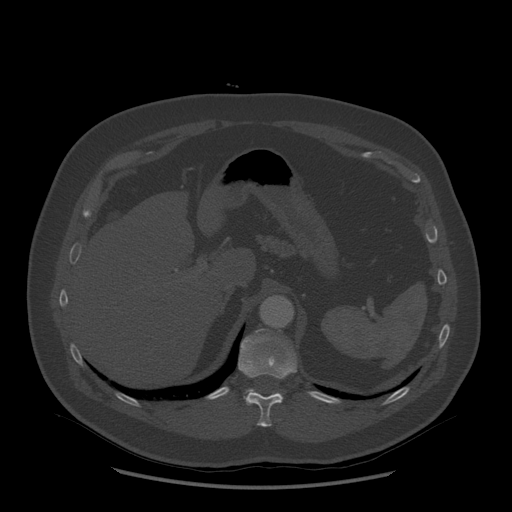
[im 164/192  soft-tissue]
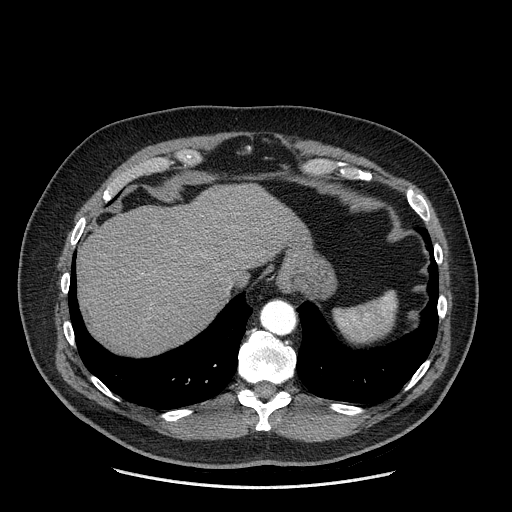
[im 164/192  lung]
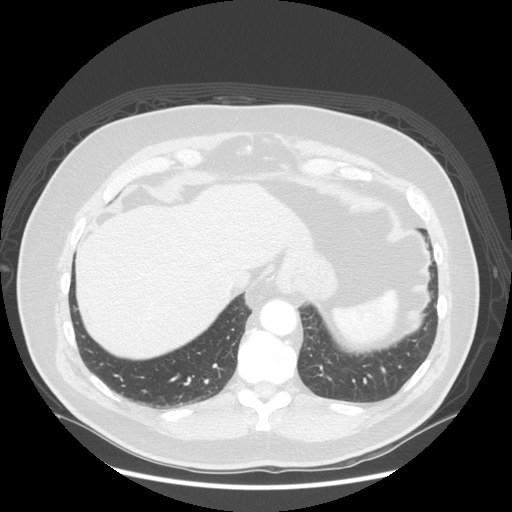
[im 178/192  soft-tissue]
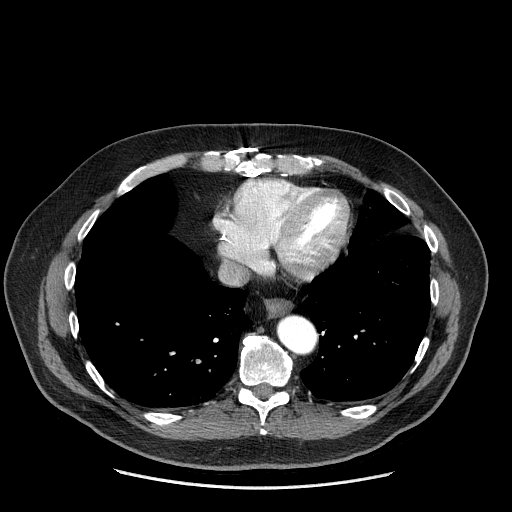
[im 178/192  lung]
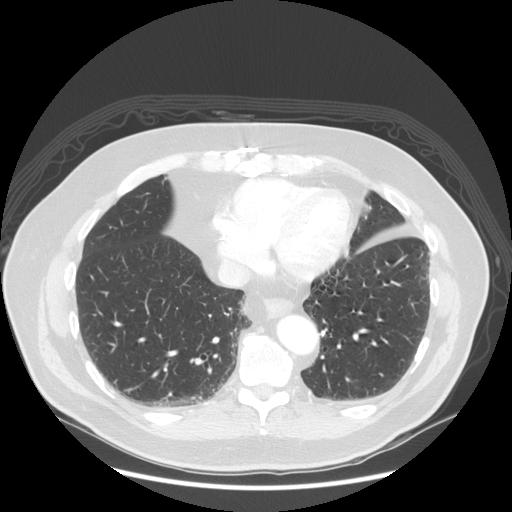

[12 of 46 positions shown; findings below may reference images not displayed]

Arterial findings:
Aorta:                  There is an infrarenal abdominal aortic
aneurysm that measures 5.5 x 5.3 cm.  This aneurysm previously
measured 4.3 x 3.9 cm in 6737.  The aorta at the level of the right
renal arteries measures up to 3.1 cm.  The distal abdominal aorta
measures 3.1 cm. Large amount mural thrombus within the aneurysm c
sac.  Thrombus encompasses greater than 50% of the lumen.

Celiac axis:            There is a common trunk to the celiac trunk
and superior mesenteric artery.  This trunk is widely patent with
minimal calcifications. Incidentally, the left gastric artery
originates off the aorta, just above the common celiac/SMA trunk.

Superior mesenteric:Common trunk with the celiac artery.  Main SMA
branches are patent.

Left renal:             The main left renal artery is patent but
there may be a small kink or narrowing at the origin.  There is a
very small accessory renal artery above the main left renal artery.
Importantly, there is an inferior left accessory renal artery that
originates along the lower aspect of the large aortic aneurysm.

Right renal:            The main right renal artery is patent.
There is a superior accessory right renal artery.

Inferior mesenteric:Patent.  Originates along the inferior aspect
of the aneurysm sac.

Left iliac:             Plaque with mild irregularity at the origin
of the left common iliac artery.  There is an aneurysm involving
the distal left common iliac artery that measures up to 2.1 cm.
This aneurysm previously measured 1.8 cm.  This aneurysm may extend
into the proximal left internal iliac artery.  The left external
iliac artery measures up to 0.9 cm and patent.  The proximal left
femoral arteries are patent.

Right iliac:            Mild plaque in the right common iliac
artery.  The right internal and right external iliac arteries are
patent.  The right external iliac artery measures 0.8 cm.  The
proximal right femoral arteries are patent.

Venous findings:  The IVC is patent.  There is a retroaortic left
renal vein.

 Review of the MIP images confirms the above findings.

Nonvascular findings: Stable punctate nodules at the left lung base
which appear to be calcified.  Otherwise, the lung bases are clear.
No evidence for free intraperitoneal air.  There is a small hiatal
hernia.  Small diverticulum near the descending duodenum.  No gross
abnormality to the pancreas, adrenal glands spleen, gallbladder or
liver.  No significant free fluid or lymphadenopathy.  There is
chronic stranding or densities in the left inguinal region.  No
gross abnormality to the prostate, seminal vesicles or urinary
bladder.  There are diverticula of the sigmoid colon without acute
inflammatory changes. There appears to be a short normal appendix.
No acute bony abnormality.
IMPRESSION: The abdominal aortic aneurysm has enlarged since 6737.
The aneurysm now measures up to 5.5 cm.

2.1 cm aneurysm involving the distal left common iliac artery.
This aneurysm has slightly enlarged.

Bilateral accessory renal arteries.  The inferior accessory left
renal artery originates along the lower aspect of the aneurysm sac.
In addition, there may be mild narrowing at the origin of the main
left renal artery.

Common trunk to the celiac artery and SMA.

Colonic diverticulosis.

## 2014-05-29 ENCOUNTER — Other Ambulatory Visit: Payer: Self-pay | Admitting: Family Medicine

## 2014-07-12 ENCOUNTER — Other Ambulatory Visit: Payer: Self-pay | Admitting: Family Medicine

## 2014-07-12 ENCOUNTER — Other Ambulatory Visit: Payer: Self-pay | Admitting: Cardiovascular Disease

## 2014-07-12 NOTE — Telephone Encounter (Signed)
Please advise on refills. Patient still has failed to schedule an appointment. Thanks, MI

## 2014-07-22 ENCOUNTER — Encounter: Payer: Self-pay | Admitting: Family Medicine

## 2014-07-22 ENCOUNTER — Ambulatory Visit (INDEPENDENT_AMBULATORY_CARE_PROVIDER_SITE_OTHER): Payer: Medicare HMO | Admitting: Family Medicine

## 2014-07-22 VITALS — BP 112/70 | HR 62 | Ht 71.0 in | Wt 226.0 lb

## 2014-07-22 DIAGNOSIS — I2510 Atherosclerotic heart disease of native coronary artery without angina pectoris: Secondary | ICD-10-CM

## 2014-07-22 DIAGNOSIS — I10 Essential (primary) hypertension: Secondary | ICD-10-CM | POA: Diagnosis not present

## 2014-07-22 DIAGNOSIS — N5201 Erectile dysfunction due to arterial insufficiency: Secondary | ICD-10-CM

## 2014-07-22 DIAGNOSIS — E785 Hyperlipidemia, unspecified: Secondary | ICD-10-CM

## 2014-07-22 DIAGNOSIS — Z0000 Encounter for general adult medical examination without abnormal findings: Secondary | ICD-10-CM | POA: Diagnosis not present

## 2014-07-22 DIAGNOSIS — K219 Gastro-esophageal reflux disease without esophagitis: Secondary | ICD-10-CM

## 2014-07-22 LAB — COMPREHENSIVE METABOLIC PANEL
ALT: 19 U/L (ref 0–53)
AST: 20 U/L (ref 0–37)
Albumin: 4.3 g/dL (ref 3.5–5.2)
Alkaline Phosphatase: 103 U/L (ref 39–117)
BUN: 13 mg/dL (ref 6–23)
CO2: 28 mEq/L (ref 19–32)
CREATININE: 1.08 mg/dL (ref 0.50–1.35)
Calcium: 9.6 mg/dL (ref 8.4–10.5)
Chloride: 99 mEq/L (ref 96–112)
Glucose, Bld: 103 mg/dL — ABNORMAL HIGH (ref 70–99)
Potassium: 5 mEq/L (ref 3.5–5.3)
SODIUM: 139 meq/L (ref 135–145)
TOTAL PROTEIN: 7.4 g/dL (ref 6.0–8.3)
Total Bilirubin: 1.1 mg/dL (ref 0.2–1.2)

## 2014-07-22 LAB — LIPID PANEL
CHOL/HDL RATIO: 2.9 ratio
Cholesterol: 151 mg/dL (ref 0–200)
HDL: 52 mg/dL (ref >39)
LDL CALC: 70 mg/dL (ref 0–99)
Triglycerides: 145 mg/dL (ref <150)
VLDL: 29 mg/dL (ref 0–40)

## 2014-07-22 LAB — POCT URINALYSIS DIPSTICK
Bilirubin, UA: NEGATIVE
Blood, UA: NEGATIVE
Glucose, UA: NEGATIVE
KETONES UA: NEGATIVE
LEUKOCYTES UA: NEGATIVE
Nitrite, UA: NEGATIVE
Protein, UA: NEGATIVE
SPEC GRAV UA: 1.02
UROBILINOGEN UA: NEGATIVE
pH, UA: 6

## 2014-07-22 LAB — CBC WITH DIFFERENTIAL/PLATELET
BASOS PCT: 0 % (ref 0–1)
Basophils Absolute: 0 10*3/uL (ref 0.0–0.1)
Eosinophils Absolute: 0.3 10*3/uL (ref 0.0–0.7)
Eosinophils Relative: 4 % (ref 0–5)
HCT: 46.5 % (ref 39.0–52.0)
Hemoglobin: 16.4 g/dL (ref 13.0–17.0)
Lymphocytes Relative: 36 % (ref 12–46)
Lymphs Abs: 2.6 10*3/uL (ref 0.7–4.0)
MCH: 33 pg (ref 26.0–34.0)
MCHC: 35.3 g/dL (ref 30.0–36.0)
MCV: 93.6 fL (ref 78.0–100.0)
MONO ABS: 0.6 10*3/uL (ref 0.1–1.0)
MONOS PCT: 9 % (ref 3–12)
MPV: 12.6 fL — AB (ref 8.6–12.4)
NEUTROS ABS: 3.7 10*3/uL (ref 1.7–7.7)
Neutrophils Relative %: 51 % (ref 43–77)
PLATELETS: 167 10*3/uL (ref 150–400)
RBC: 4.97 MIL/uL (ref 4.22–5.81)
RDW: 13.9 % (ref 11.5–15.5)
WBC: 7.2 10*3/uL (ref 4.0–10.5)

## 2014-07-22 MED ORDER — POTASSIUM CHLORIDE CRYS ER 10 MEQ PO TBCR: 10.0000 meq | EXTENDED_RELEASE_TABLET | Freq: Every day | ORAL | Status: DC

## 2014-07-22 MED ORDER — ATORVASTATIN CALCIUM 20 MG PO TABS: ORAL_TABLET | ORAL | Status: DC

## 2014-07-22 MED ORDER — METOPROLOL TARTRATE 25 MG PO TABS: 25.0000 mg | ORAL_TABLET | Freq: Two times a day (BID) | ORAL | Status: DC

## 2014-07-22 MED ORDER — HYDROCHLOROTHIAZIDE 25 MG PO TABS: 25.0000 mg | ORAL_TABLET | Freq: Every day | ORAL | Status: DC

## 2014-07-22 NOTE — Progress Notes (Signed)
   Subjective:    Patient ID: Martin Dickerson, male    DOB: [birth year], 69 y.o.   MRN: [id number]  HPI He is here for complete examination. He has no particular concerns or complaints. He has had no difficulty with chest pain, shortness of breath, PND. Does have a previous history of ASHD as well as AAA with a sleeve been placed. Does have reflux disease and does use Zantac on an as-needed basis. He is on Lipitor and having no real difficulties with that. He does have some difficulty with ED but apparently does not need medication for it at the present time. He exercises regularly. Quit smoking several years ago. He drinks wine but not to excess. His home life is going well. He is retired but does so on [employer]   Review of Systems  All other systems reviewed and are negative.      Objective:   Physical Exam BP 112/70 mmHg  Pulse 62  Ht 5\' 11"  (1.803 m)  Wt 226 lb (102.513 kg)  BMI 31.53 kg/m2  SpO2 99%  General Appearance:    Alert, cooperative, no distress, appears stated age  Head:    Normocephalic, without obvious abnormality, atraumatic  Eyes:    PERRL, conjunctiva/corneas clear, EOM's intact, fundi    benign  Ears:    Normal TM's and external ear canals  Nose:   Nares normal, mucosa normal, no drainage or sinus   tenderness  Throat:   Lips, mucosa, and tongue normal; teeth and gums normal  Neck:   Supple, no lymphadenopathy;  thyroid:  no   enlargement/tenderness/nodules; no carotid   bruit or JVD  Back:    Spine nontender, no curvature, ROM normal, no CVA     tenderness  Lungs:     Clear to auscultation bilaterally without wheezes, rales or     ronchi; respirations unlabored  Chest Wall:    No tenderness or deformity   Heart:    Regular rate and rhythm, S1 and S2 normal, no murmur, rub   or gallop  Breast Exam:    No chest wall tenderness, masses or gynecomastia  Abdomen:     Soft, non-tender, nondistended, normoactive bowel sounds,    no masses, no hepatosplenomegaly          Extremities:   No clubbing, cyanosis or edema  Pulses:   2+ and symmetric all extremities  Skin:   Skin color, texture, turgor normal, no rashes or lesions  Lymph nodes:   Cervical, supraclavicular, and axillary nodes normal  Neurologic:   CNII-XII intact, normal strength, sensation and gait; reflexes 2+ and symmetric throughout          Psych:   Normal mood, affect, hygiene and grooming.          Assessment & Plan:  Routine general medical examination at a health care facility - Plan: POCT Urinalysis Dipstick, CBC with Differential/Platelet, Comprehensive metabolic panel, Lipid panel  ASHD (arteriosclerotic heart disease) - Plan: CBC with Differential/Platelet, Comprehensive metabolic panel, Lipid panel, metoprolol tartrate (LOPRESSOR) 25 MG tablet  Gastroesophageal reflux disease without esophagitis  Hyperlipidemia LDL goal <70 - Plan: Lipid panel, atorvastatin (LIPITOR) 20 MG tablet  Essential hypertension - Plan: CBC with Differential/Platelet, Comprehensive metabolic panel, hydrochlorothiazide (HYDRODIURIL) 25 MG tablet, metoprolol tartrate (LOPRESSOR) 25 MG tablet, potassium chloride (K-DUR,KLOR-CON) 10 MEQ tablet  Erectile dysfunction due to arterial insufficiency  I encouraged him to continue to take good care of himself.

## 2014-08-12 ENCOUNTER — Other Ambulatory Visit: Payer: Self-pay | Admitting: Family Medicine

## 2014-10-28 ENCOUNTER — Other Ambulatory Visit: Payer: Self-pay

## 2014-10-28 DIAGNOSIS — Z01812 Encounter for preprocedural laboratory examination: Secondary | ICD-10-CM

## 2014-10-28 DIAGNOSIS — Z95828 Presence of other vascular implants and grafts: Secondary | ICD-10-CM

## 2014-10-28 LAB — CREATININE, SERUM: Creat: 1.08 mg/dL (ref 0.50–1.35)

## 2014-10-28 LAB — BUN: BUN: 12 mg/dL (ref 6–23)

## 2014-10-29 ENCOUNTER — Ambulatory Visit
Admission: RE | Admit: 2014-10-29 | Discharge: 2014-10-29 | Disposition: A | Discharge status: Home / Self Care | Payer: Commercial Managed Care - HMO | Source: Ambulatory Visit | Attending: Vascular Surgery | Admitting: Vascular Surgery

## 2014-10-29 ENCOUNTER — Ambulatory Visit: Payer: Commercial Managed Care - HMO | Admitting: Vascular Surgery

## 2014-10-29 ENCOUNTER — Other Ambulatory Visit: Payer: Commercial Managed Care - HMO

## 2014-10-29 DIAGNOSIS — I714 Abdominal aortic aneurysm, without rupture, unspecified: Secondary | ICD-10-CM

## 2014-10-29 DIAGNOSIS — I723 Aneurysm of iliac artery: Secondary | ICD-10-CM | POA: Diagnosis not present

## 2014-10-29 MED ORDER — IOPAMIDOL (ISOVUE-370) INJECTION 76%
75.0000 mL | Freq: Once | INTRAVENOUS | Status: AC | PRN
  Administered 2014-10-29: 75 mL via INTRAVENOUS

## 2014-11-12 ENCOUNTER — Ambulatory Visit: Payer: Commercial Managed Care - HMO | Admitting: Vascular Surgery

## 2014-11-14 ENCOUNTER — Encounter: Payer: Self-pay | Admitting: Vascular Surgery

## 2014-11-15 ENCOUNTER — Encounter: Payer: Self-pay | Admitting: Vascular Surgery

## 2014-11-15 ENCOUNTER — Ambulatory Visit (INDEPENDENT_AMBULATORY_CARE_PROVIDER_SITE_OTHER): Payer: Commercial Managed Care - HMO | Admitting: Vascular Surgery

## 2014-11-15 VITALS — BP 124/81 | HR 65 | Resp 16 | Ht 72.0 in | Wt 236.0 lb

## 2014-11-15 DIAGNOSIS — I714 Abdominal aortic aneurysm, without rupture, unspecified: Secondary | ICD-10-CM

## 2014-11-15 DIAGNOSIS — Z48812 Encounter for surgical aftercare following surgery on the circulatory system: Secondary | ICD-10-CM | POA: Diagnosis not present

## 2014-11-15 NOTE — Progress Notes (Signed)
The patient resents today for follow-up of stent graft repair of his abdominal aortic aneurysm. His procedure was in March 2014. He continues to be the quite active with no new medical difficulties. Does have a remote history of coronary bypass grafting reports that he is been quite stable active venous had no difficulty. He has no symptoms referable to his aneurysm.  Past Medical History  Diagnosis Date  . ASHD (arteriosclerotic heart disease)   . Obesity   . Hemorrhoids   . AAA (abdominal aortic aneurysm) 2007  . ED (erectile dysfunction)   . Hyperlipidemia     takes Lipitor daily  . SVT (supraventricular tachycardia)   . PONV (postoperative nausea and vomiting)   . Hypertension     takes Metoprolol and HCTZ daily  . Myocardial infarction 2002    has never had to take Nitroglycerin  . Emphysema      a "Touch" per pt  . GERD (gastroesophageal reflux disease)     takes Zantac nightly  . History of colon polyps     History  Substance Use Topics  . Smoking status: Former Smoker -- 35 years    Types: Cigarettes    Quit date: 06/28/2000  . Smokeless tobacco: Never Used     Comment: quit in 2002  . Alcohol Use: No    Family History  Problem Relation Age of Onset  . Stroke Mother   . Hypertension Father   . Heart disease Maternal Uncle     No Known Allergies   Current outpatient prescriptions:  .  aspirin EC 81 MG tablet, Take 81 mg by mouth daily.  , Disp: , Rfl:  .  atorvastatin (LIPITOR) 20 MG tablet, TAKE 1 TABLET EVERY DAY AT BEDTIME, Disp: 90 tablet, Rfl: 3 .  hydrochlorothiazide (HYDRODIURIL) 25 MG tablet, Take 1 tablet (25 mg total) by mouth daily., Disp: 90 tablet, Rfl: 3 .  metoprolol tartrate (LOPRESSOR) 25 MG tablet, Take 1 tablet (25 mg total) by mouth 2 (two) times daily., Disp: 180 tablet, Rfl: 3 .  nitroGLYCERIN (NITROSTAT) 0.4 MG SL tablet, DISSOLVE 1 TABLET UNDER THE TONGUE EVERY 5 MINUTES AS NEEDED, Disp: 75 tablet, Rfl: 0 .  potassium chloride  (K-DUR,KLOR-CON) 10 MEQ tablet, Take 1 tablet (10 mEq total) by mouth daily., Disp: 90 tablet, Rfl: 3 .  ranitidine (ZANTAC) 150 MG tablet, TAKE 1 TABLET AT BEDTIME, Disp: 90 tablet, Rfl: 3  Filed Vitals:   11/15/14 1252  BP: 124/81  Pulse: 65  Resp: 16  Height: 6' (1.829 m)  Weight: 236 lb (107.049 kg)    Body mass index is 32 kg/(m^2).       On physical exam well-developed well-nourished gentleman in no acute distress Respirations are equal in nonlabored Abdomen soft nontender with no palpable aneurysm and no pulsatile mass Neurologically he is grossly intact Pulse status 2+ dorsalis pedis pulses bilaterally   He did undergo repeat CT scan on 10/28/2004 and is here today for discussion of this. I have reviewed his report and also actual films. This does show no change in the orientation of the stent graft. 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 His aneurysm sac has continued to enlarge slightly. Maximal diameter now is 5.9 as compared to 5.6 cm in the same area.  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 I discussed this at great length with the patient. I did explain that  this is concerning and I have recommended further evaluation with interventional radiology to determine treatment options. I did discuss potentially quarreling his I am a with microcatheter technique versus injection of glue into the aneurysm sac. I have discussed the case with Dr. Aletta Edouard with [facility] radiology. He will see him in consultation and make further recommendations.

## 2014-11-15 NOTE — Addendum Note (Signed)
Addended by: Dorthula Rue L on: 11/15/2014 03:59 PM   Modules accepted: Orders

## 2014-12-23 ENCOUNTER — Other Ambulatory Visit: Payer: Self-pay

## 2015-04-24 ENCOUNTER — Telehealth: Payer: Self-pay

## 2015-04-24 MED ORDER — RANITIDINE HCL 150 MG PO TABS: 150.0000 mg | ORAL_TABLET | Freq: Every day | ORAL | Status: DC

## 2015-04-24 NOTE — Telephone Encounter (Signed)
Refill request for Ranitidine 150mg  #90

## 2015-05-12 ENCOUNTER — Other Ambulatory Visit: Payer: Self-pay | Admitting: *Deleted

## 2015-05-12 ENCOUNTER — Other Ambulatory Visit: Payer: Self-pay | Admitting: Vascular Surgery

## 2015-05-12 DIAGNOSIS — Z01812 Encounter for preprocedural laboratory examination: Secondary | ICD-10-CM

## 2015-05-13 ENCOUNTER — Ambulatory Visit
Admission: RE | Admit: 2015-05-13 | Discharge: 2015-05-13 | Disposition: A | Discharge status: Home / Self Care | Payer: Commercial Managed Care - HMO | Source: Ambulatory Visit | Attending: Vascular Surgery | Admitting: Vascular Surgery

## 2015-05-13 DIAGNOSIS — I714 Abdominal aortic aneurysm, without rupture, unspecified: Secondary | ICD-10-CM

## 2015-05-13 DIAGNOSIS — Z48812 Encounter for surgical aftercare following surgery on the circulatory system: Secondary | ICD-10-CM

## 2015-05-13 LAB — CREATININE, SERUM: CREATININE: 1.05 mg/dL (ref 0.70–1.18)

## 2015-05-13 MED ORDER — IOPAMIDOL (ISOVUE-370) INJECTION 76%
75.0000 mL | Freq: Once | INTRAVENOUS | Status: AC | PRN
  Administered 2015-05-13: 75 mL via INTRAVENOUS

## 2015-05-20 ENCOUNTER — Encounter: Payer: Self-pay | Admitting: Vascular Surgery

## 2015-05-20 ENCOUNTER — Ambulatory Visit: Payer: Commercial Managed Care - HMO | Admitting: Vascular Surgery

## 2015-05-27 ENCOUNTER — Ambulatory Visit (INDEPENDENT_AMBULATORY_CARE_PROVIDER_SITE_OTHER): Payer: Commercial Managed Care - HMO | Admitting: Vascular Surgery

## 2015-05-27 ENCOUNTER — Encounter: Payer: Self-pay | Admitting: Vascular Surgery

## 2015-05-27 VITALS — BP 136/77 | HR 70 | Temp 98.0°F | Resp 18 | Ht 71.5 in | Wt 244.3 lb

## 2015-05-27 DIAGNOSIS — I714 Abdominal aortic aneurysm, without rupture, unspecified: Secondary | ICD-10-CM

## 2015-05-27 NOTE — Progress Notes (Signed)
[facility] [place]  Patient name: Martin Dickerson MRN: [id number] DOB: 1945/10/09 Sex: male  REASON FOR VISIT:  Follow-up of stent graft repair of abdominal aortic aneurysm  HPI: Martin Dickerson is a 69 y.o. male  Seen today for follow-up of stent graft repair of abdominal aortic aneurysm. Repair was in March 2014. His follow-up CT scans have shown a persistent type II endoleak and DT scan 6 months ago suggested some expansion ve aneurysm sac size. He iconihe has no symptomsreferable .  Past Medical History  Diagnosis Date  . ASHD (arteriosclerotic heart disease)   . Obesity   . Hemorrhoids   . AAA (abdominal aortic aneurysm) ([place]) 2007  . ED (erectile dysfunction)   . Hyperlipidemia     takes Lipitor daily  . SVT (supraventricular tachycardia) ([place])   . PONV (postoperative nausea and vomiting)   . Hypertension     takes Metoprolol and HCTZ daily  . Myocardial infarction [facility]) 2002    has never had to take Nitroglycerin  . Emphysema      a "Touch" per pt  . GERD (gastroesophageal reflux disease)     takes Zantac nightly  . History of colon polyps     Family History  Problem Relation Age of Onset  . Stroke Mother   . Hypertension Father   . Heart disease Maternal Uncle     SOCIAL HISTORY: Social History  Substance Use Topics  . Smoking status: Former Smoker -- 35 years    Types: Cigarettes    Quit date: 06/28/2000  . Smokeless tobacco: Never Used     Comment: quit in 2002  . Alcohol Use: No    No Known Allergies  Current Outpatient Prescriptions  Medication Sig Dispense Refill  . aspirin EC 81 MG tablet Take 81 mg by mouth daily.      Marland Kitchen atorvastatin (LIPITOR) 20 MG tablet TAKE 1 TABLET EVERY DAY AT BEDTIME 90 tablet 3  . hydrochlorothiazide (HYDRODIURIL) 25 MG tablet Take 1 tablet (25 mg total) by mouth daily. 90 tablet 3  . metoprolol tartrate (LOPRESSOR) 25 MG tablet Take 1 tablet (25 mg total) by mouth 2 (two) times daily. 180 tablet 3  .  nitroGLYCERIN (NITROSTAT) 0.4 MG SL tablet DISSOLVE 1 TABLET UNDER THE TONGUE EVERY 5 MINUTES AS NEEDED 75 tablet 0  . potassium chloride (K-DUR,KLOR-CON) 10 MEQ tablet Take 1 tablet (10 mEq total) by mouth daily. 90 tablet 3  . ranitidine (ZANTAC) 150 MG tablet Take 1 tablet (150 mg total) by mouth at bedtime. 90 tablet 3   No current facility-administered medications for this visit.    REVIEW OF SYSTEMS:  [X]  denotes positive finding, [ ]  denotes negative finding Cardiac  Comments:  Chest pain or chest pressure:    Shortness of breath upon exertion:    Short of breath when lying flat:    Irregular heart rhythm:        Vascular    Pain in calf, thigh, or hip brought on by ambulation:    Pain in feet at night that wakes you up from your sleep:     Blood clot in your veins:    Leg swelling:         Pulmonary    Oxygen at home:    Productive cough:     Wheezing:         Neurologic    Sudden weakness in arms or legs:     Sudden numbness in  arms or legs:     Sudden onset of difficulty speaking or slurred speech:    Temporary loss of vision in one eye:     Problems with dizziness:         Gastrointestinal    Blood in stool:     Vomited blood:         Genitourinary    Burning when urinating:     Blood in urine:        Psychiatric    Major depression:         Hematologic    Bleeding problems:    Problems with blood clotting too easily:        Skin    Rashes or ulcers:        Constitutional    Fever or chills:      PHYSICAL EXAM: Filed Vitals:   05/27/15 1216  BP: 136/77  Pulse: 70  Temp: 98 F (36.7 C)  TempSrc: Oral  Resp: 18  Height: 5' 11.5" (1.816 m)  Weight: 244 lb 4.8 oz (110.814 kg)  SpO2: 97%    GENERAL: The patient is a well-nourished male, in no acute distress. The vital signs are documented above. CARDIAC: There is a regular rate and rhythm.  VASCULAR:  Palpable radial and femoral pulses bilaterally PULMONARY: There is good air exchange  bilaterally without wheezing or rales. ABDOMEN: Soft and non-tender with normal pitched bowel sounds.  No pulsatile mass noted MUSCULOSKELETAL: There are no major deformities or cyanosis. NEUROLOGIC: No focal weakness or paresthesias are detected. SKIN: There are no ulcers or rashes noted. PSYCHIATRIC: The patient has a normal affect.  DATA:   I reviewed his CT scan from several days prior with the patient. This does show persistent type II endoleak but does not show any expansion and his aneurysm sac with a maximal diameter of 5.8 cm  MEDICAL ISSUES:  I very long discussion with patient regarding significance of his type II endoleak. Explained this is common and is only concerning if there is continued increase in sac size. Fortunately this has not been the case over the past 6 months. We will see him again in one year with repeat CT scan at that time. If he continues to circumflex showed no enlargement in a drop back to ultrasound follow-up. He was relieved this discussion will see [place] in one year  No Follow-up on file.   Curt Jews [facility] [place]: [phone]

## 2015-07-18 ENCOUNTER — Other Ambulatory Visit: Payer: Self-pay | Admitting: Family Medicine

## 2015-08-01 ENCOUNTER — Other Ambulatory Visit: Payer: Self-pay | Admitting: Family Medicine

## 2015-08-08 ENCOUNTER — Telehealth: Payer: Self-pay | Admitting: Family Medicine

## 2015-08-08 ENCOUNTER — Other Ambulatory Visit: Payer: Self-pay | Admitting: Cardiovascular Disease

## 2015-08-08 NOTE — Telephone Encounter (Signed)
PT HAS APPT ON Monday MORNING FOR A MEDICARE WELL VISIT pt is out of hydrochlorthiazide 25 mg he called [facility] to get the refills and for them to send [place] the refill request pt was wondering if we could send him a weeks worth to the [facility] on [address], [place], [place] [address] said if we could please call him at [phone] to let him know that it is done

## 2015-08-08 NOTE — Telephone Encounter (Signed)
Yes, please defer to PCP or patient needs to schedule an ov

## 2015-08-08 NOTE — Telephone Encounter (Signed)
Patient has not been seen since 2014. Looks like pcp has been refilling. Ok to deny and have pharmacy defer to pcp for refill?

## 2015-08-08 NOTE — Telephone Encounter (Signed)
Called in #7 HCTZ 25mg  per J[facility] to [facility] & pt informed, to hold pt til appt

## 2015-08-11 ENCOUNTER — Ambulatory Visit (INDEPENDENT_AMBULATORY_CARE_PROVIDER_SITE_OTHER): Payer: Commercial Managed Care - HMO | Admitting: Family Medicine

## 2015-08-11 ENCOUNTER — Encounter: Payer: Self-pay | Admitting: Family Medicine

## 2015-08-11 VITALS — BP 130/82 | HR 67 | Ht 71.5 in | Wt 247.0 lb

## 2015-08-11 DIAGNOSIS — E785 Hyperlipidemia, unspecified: Secondary | ICD-10-CM | POA: Diagnosis not present

## 2015-08-11 DIAGNOSIS — I714 Abdominal aortic aneurysm, without rupture, unspecified: Secondary | ICD-10-CM

## 2015-08-11 DIAGNOSIS — I10 Essential (primary) hypertension: Secondary | ICD-10-CM

## 2015-08-11 DIAGNOSIS — I2510 Atherosclerotic heart disease of native coronary artery without angina pectoris: Secondary | ICD-10-CM

## 2015-08-11 DIAGNOSIS — Z1159 Encounter for screening for other viral diseases: Secondary | ICD-10-CM | POA: Diagnosis not present

## 2015-08-11 DIAGNOSIS — Z87891 Personal history of nicotine dependence: Secondary | ICD-10-CM

## 2015-08-11 DIAGNOSIS — K219 Gastro-esophageal reflux disease without esophagitis: Secondary | ICD-10-CM

## 2015-08-11 DIAGNOSIS — N5201 Erectile dysfunction due to arterial insufficiency: Secondary | ICD-10-CM

## 2015-08-11 LAB — COMPREHENSIVE METABOLIC PANEL
ALT: 28 U/L (ref 9–46)
AST: 22 U/L (ref 10–35)
Albumin: 3.9 g/dL (ref 3.6–5.1)
Alkaline Phosphatase: 103 U/L (ref 40–115)
BILIRUBIN TOTAL: 1.1 mg/dL (ref 0.2–1.2)
BUN: 15 mg/dL (ref 7–25)
CALCIUM: 9.4 mg/dL (ref 8.6–10.3)
CO2: 28 mmol/L (ref 20–31)
Chloride: 100 mmol/L (ref 98–110)
Creat: 1.18 mg/dL (ref 0.70–1.18)
GLUCOSE: 91 mg/dL (ref 65–99)
POTASSIUM: 4.8 mmol/L (ref 3.5–5.3)
Sodium: 138 mmol/L (ref 135–146)
Total Protein: 7.1 g/dL (ref 6.1–8.1)

## 2015-08-11 LAB — LIPID PANEL
Cholesterol: 171 mg/dL (ref 125–200)
HDL: 47 mg/dL (ref >=40)
LDL CALC: 94 mg/dL (ref <130)
TRIGLYCERIDES: 152 mg/dL — AB (ref <150)
Total CHOL/HDL Ratio: 3.6 Ratio (ref <=5.0)
VLDL: 30 mg/dL (ref <30)

## 2015-08-11 MED ORDER — NITROGLYCERIN 0.4 MG SL SUBL: SUBLINGUAL_TABLET | SUBLINGUAL | Status: DC

## 2015-08-11 MED ORDER — ATORVASTATIN CALCIUM 20 MG PO TABS: ORAL_TABLET | ORAL | Status: DC

## 2015-08-11 MED ORDER — HYDROCHLOROTHIAZIDE 25 MG PO TABS: 25.0000 mg | ORAL_TABLET | Freq: Every day | ORAL | Status: DC

## 2015-08-11 MED ORDER — METOPROLOL TARTRATE 25 MG PO TABS: 25.0000 mg | ORAL_TABLET | Freq: Two times a day (BID) | ORAL | Status: DC

## 2015-08-11 NOTE — Progress Notes (Signed)
Patient ID: Martin Dickerson, male   DOB: Apr 29, 1946, 70 y.o.   MRN: [id number] REINALDO FETSKO is a 70 y.o. male who presents for annual wellness visit and follow-up on chronic medical conditions.  He has the following concerns:He does have underlying ASHD and his had no difficulty with chest pain, shortness of breath, PND or DOE. He had his CABG in 2002.His last appointment with his cardiologist was about 2 years ago. He is followed by Dr. Donnetta Hutching for his AAA. He has seen Dr. Watt Climes in the past for his reflux.There is also a remote history of COPD however he quit smoking several years ago. There is history of ED however he states he is presently not having any difficulty with that. Family and social history as well as health maintenance and immunizations were reviewed.   Immunization History  Administered Date(s) Administered  . DTaP 04/06/2002  . Influenza Split 03/22/2011, 03/13/2012  . Influenza Whole 04/06/2002, 04/26/2007  . Influenza-Unspecified 03/28/2013, 03/28/2014  . Pneumococcal Conjugate-13 07/19/2013  . Pneumococcal Polysaccharide-23 06/05/2007  . Tdap 07/17/2012  . Zoster 06/05/2007   Last colonoscopy: Unknown ~~ 6/7 yrs Last PSA: Dentist:  Dentures Ophtho: [facility] [employer] Exercise:  Walking every other day  Other doctors caring for patient include:Doctor[facility]  Lucillie Garfinkel   Depression screen:  See questionnaire below.     Depression screen [facility] 2/9 08/11/2015 07/22/2014 07/17/2012  Decreased Interest 0 0 0  Down, Depressed, Hopeless 0 0 0  PHQ - 2 Score 0 0 0    Fall Screen: See Questionaire below.   Fall Risk  08/11/2015 07/22/2014 07/17/2012  Falls in the past year? No No No    ADL screen:  See questionnaire below.  Functional Status Survey:     End of Life Discussion:  Patient has a living will and medical power of attorney   Review of Systems  Constitutional: -fever, -chills, -sweats, -unexpected weight change, -anorexia, -fatigue Allergy: -sneezing,  -itching, -congestion Dermatology: denies changing moles, rash, lumps, new worrisome lesions ENT: -runny nose, -ear pain, -sore throat, -hoarseness, -sinus pain, -teeth pain, -tinnitus, -hearing loss, -epistaxis Cardiology:  -chest pain, -palpitations, -edema, -orthopnea, -paroxysmal nocturnal dyspnea Respiratory: -cough, -shortness of breath, -dyspnea on exertion, -wheezing, -hemoptysis Gastroenterology: -abdominal pain, -nausea, -vomiting, -diarrhea, -constipation, -blood in stool, -changes in bowel movement, -dysphagia Hematology: -bleeding or bruising problems Musculoskeletal: -arthralgias, -myalgias, -joint swelling, -back pain, -neck pain, -cramping, -gait changes Ophthalmology: -vision changes, -eye redness, -itching, -discharge Urology: -dysuria, -difficulty urinating, -hematuria, -urinary frequency, -urgency, incontinence Neurology: -headache, -weakness, -tingling, -numbness, -speech abnormality, -memory loss, -falls, -dizziness Psychology:  -depressed mood, -agitation, -sleep problems   PHYSICAL EXAM:  BP 130/82 mmHg  Pulse 67  Ht 5' 11.5" (1.816 m)  Wt 247 lb (112.038 kg)  BMI 33.97 kg/m2  SpO2 97%  General Appearance: Alert, cooperative, no distress, appears stated age Head: Normocephalic, without obvious abnormality, atraumatic Eyes: PERRL, conjunctiva/corneas clear, EOM's intact, fundi benign Ears: Normal TM's and external ear canals Nose: Nares normal, mucosa normal, no drainage or sinus   tenderness Throat: Lips, mucosa, and tongue normal; teeth and gums normal Neck: Supple, no lymphadenopathy, thyroid:no enlargement/tenderness/nodules; no carotid bruit or JVD Back: Spine nontender, no curvature, ROM normal, no CVA tenderness Lungs: Clear to auscultation bilaterally without wheezes, rales or ronchi; respirations unlabored Chest Wall: No tenderness or deformity Heart: Regular rate and rhythm, S1 and S2 normal, no murmur, rub or gallop Abdomen: Soft, non-tender,  nondistended, normoactive bowel sounds, no masses, no hepatosplenomegaly  Extremities: No clubbing,  cyanosis or edema Pulses: 2+ and symmetric all extremities Skin: Skin color, texture, turgor normal, no rashes or lesions Lymph nodes: Cervical, supraclavicular, and axillary nodes normal Neurologic: CNII-XII intact, normal strength, sensation and gait; reflexes 2+ and symmetric throughout   Psych: Normal mood, affect, hygiene and grooming  ASSESSMENT/PLAN: ASHD (arteriosclerotic heart disease) - Plan: CBC with Differential/Platelet, Comprehensive metabolic panel, Lipid panel, metoprolol tartrate (LOPRESSOR) 25 MG tablet, nitroGLYCERIN (NITROSTAT) 0.4 MG SL tablet  AAA (abdominal aortic aneurysm) without rupture (HCC)  Gastroesophageal reflux disease without esophagitis  Hyperlipidemia LDL goal <70 - Plan: Lipid panel, atorvastatin (LIPITOR) 20 MG tablet  Essential hypertension - Plan: hydrochlorothiazide (HYDRODIURIL) 25 MG tablet, metoprolol tartrate (LOPRESSOR) 25 MG tablet  Erectile dysfunction due to arterial insufficiency  Former smoker  Need for hepatitis C screening test - Plan: Hepatitis C antibody     Discussed PSA screening (risks/benefits), recommended at least 30 minutes of aerobic activity at least 5 days/week; proper sunscreen use reviewed; healthy diet and alcohol recommendations (less than or equal to 2 drinks/day) reviewed; regular seatbelt use; Marland Kitchen Immunization recommendations discussed.  Colonoscopy recommendations reviewed.   Medicare Attestation I have personally reviewed: The patient's medical and social history Their use of alcohol, tobacco or illicit drugs Their current medications and supplements The patient's functional ability including ADLs,fall risks, home safety risks, cognitive, and hearing and visual impairment Diet and physical activities Evidence for depression or mood disorders  The patient's weight, height, and BMI have been recorded in the  chart.  I have made referrals, counseling, and provided education to the patient based on review of the above and I have provided the patient with a written personalized care plan for preventive services.   Definitely encouraged him to become more physically active.  Wyatt Haste, MD   08/11/2015

## 2015-08-11 NOTE — Patient Instructions (Signed)
150 minutes a week of something physical which translates into 20 minutes everyday

## 2015-08-12 ENCOUNTER — Other Ambulatory Visit: Payer: Commercial Managed Care - HMO

## 2015-08-12 DIAGNOSIS — Z0000 Encounter for general adult medical examination without abnormal findings: Secondary | ICD-10-CM

## 2015-08-12 DIAGNOSIS — Z0189 Encounter for other specified special examinations: Secondary | ICD-10-CM | POA: Diagnosis not present

## 2015-08-12 DIAGNOSIS — I10 Essential (primary) hypertension: Secondary | ICD-10-CM | POA: Diagnosis not present

## 2015-08-12 DIAGNOSIS — I2510 Atherosclerotic heart disease of native coronary artery without angina pectoris: Secondary | ICD-10-CM | POA: Diagnosis not present

## 2015-08-12 LAB — CBC WITH DIFFERENTIAL/PLATELET
BASOS PCT: 0 % (ref 0–1)
Basophils Absolute: 0 10*3/uL (ref 0.0–0.1)
EOS ABS: 0.3 10*3/uL (ref 0.0–0.7)
EOS PCT: 4 % (ref 0–5)
HCT: 45 % (ref 39.0–52.0)
Hemoglobin: 15.5 g/dL (ref 13.0–17.0)
LYMPHS PCT: 43 % (ref 12–46)
Lymphs Abs: 3 10*3/uL (ref 0.7–4.0)
MCH: 32.6 pg (ref 26.0–34.0)
MCHC: 34.4 g/dL (ref 30.0–36.0)
MCV: 94.7 fL (ref 78.0–100.0)
MONO ABS: 0.7 10*3/uL (ref 0.1–1.0)
MONOS PCT: 10 % (ref 3–12)
MPV: 11 fL (ref 8.6–12.4)
NEUTROS ABS: 3 10*3/uL (ref 1.7–7.7)
NEUTROS PCT: 43 % (ref 43–77)
PLATELETS: 181 10*3/uL (ref 150–400)
RBC: 4.75 MIL/uL (ref 4.22–5.81)
RDW: 14.6 % (ref 11.5–15.5)
WBC: 7 10*3/uL (ref 4.0–10.5)

## 2015-08-12 LAB — HEPATITIS C ANTIBODY: HCV Ab: NEGATIVE

## 2015-08-14 ENCOUNTER — Telehealth: Payer: Self-pay | Admitting: Family Medicine

## 2015-08-14 DIAGNOSIS — I10 Essential (primary) hypertension: Secondary | ICD-10-CM

## 2015-08-14 MED ORDER — HYDROCHLOROTHIAZIDE 25 MG PO TABS: 25.0000 mg | ORAL_TABLET | Freq: Every day | ORAL | Status: DC

## 2015-08-14 NOTE — Telephone Encounter (Signed)
7 pills sent to [place]. Patient notified.

## 2015-08-14 NOTE — Telephone Encounter (Signed)
Pt still has not recvd Hydrochlorothiazide med from mail order pharmacy and he is out of meds now. Requesting another weeks worth (#7) be called in to [facility] @ [phone] [address]

## 2015-09-15 ENCOUNTER — Other Ambulatory Visit: Payer: Self-pay | Admitting: Family Medicine

## 2015-09-15 NOTE — Telephone Encounter (Signed)
Is this ok to refill?  

## 2015-12-21 ENCOUNTER — Other Ambulatory Visit: Payer: Self-pay | Admitting: Family Medicine

## 2016-02-11 ENCOUNTER — Other Ambulatory Visit: Payer: Self-pay | Admitting: Family Medicine

## 2016-02-16 DIAGNOSIS — Z0100 Encounter for examination of eyes and vision without abnormal findings: Secondary | ICD-10-CM | POA: Diagnosis not present

## 2016-08-17 ENCOUNTER — Other Ambulatory Visit: Payer: Self-pay | Admitting: Family Medicine

## 2016-08-17 DIAGNOSIS — E785 Hyperlipidemia, unspecified: Secondary | ICD-10-CM

## 2016-08-17 DIAGNOSIS — I2510 Atherosclerotic heart disease of native coronary artery without angina pectoris: Secondary | ICD-10-CM

## 2016-08-17 DIAGNOSIS — I10 Essential (primary) hypertension: Secondary | ICD-10-CM

## 2016-09-02 ENCOUNTER — Ambulatory Visit (INDEPENDENT_AMBULATORY_CARE_PROVIDER_SITE_OTHER): Payer: Medicare HMO | Admitting: Family Medicine

## 2016-09-02 ENCOUNTER — Encounter: Payer: Self-pay | Admitting: Family Medicine

## 2016-09-02 VITALS — BP 110/60 | HR 77 | Ht 72.0 in | Wt 252.2 lb

## 2016-09-02 DIAGNOSIS — I714 Abdominal aortic aneurysm, without rupture, unspecified: Secondary | ICD-10-CM

## 2016-09-02 DIAGNOSIS — I2510 Atherosclerotic heart disease of native coronary artery without angina pectoris: Secondary | ICD-10-CM

## 2016-09-02 DIAGNOSIS — I10 Essential (primary) hypertension: Secondary | ICD-10-CM | POA: Diagnosis not present

## 2016-09-02 DIAGNOSIS — L570 Actinic keratosis: Secondary | ICD-10-CM

## 2016-09-02 DIAGNOSIS — K219 Gastro-esophageal reflux disease without esophagitis: Secondary | ICD-10-CM | POA: Diagnosis not present

## 2016-09-02 DIAGNOSIS — E785 Hyperlipidemia, unspecified: Secondary | ICD-10-CM | POA: Diagnosis not present

## 2016-09-02 DIAGNOSIS — N529 Male erectile dysfunction, unspecified: Secondary | ICD-10-CM | POA: Diagnosis not present

## 2016-09-02 LAB — LIPID PANEL
CHOL/HDL RATIO: 3.3 ratio (ref <5.0)
Cholesterol: 159 mg/dL (ref <200)
HDL: 48 mg/dL (ref >40)
LDL CALC: 77 mg/dL (ref <100)
Triglycerides: 168 mg/dL — ABNORMAL HIGH (ref <150)
VLDL: 34 mg/dL — ABNORMAL HIGH (ref <30)

## 2016-09-02 LAB — CBC WITH DIFFERENTIAL/PLATELET
Basophils Absolute: 0 cells/uL (ref 0–200)
Basophils Relative: 0 %
EOS PCT: 2 %
Eosinophils Absolute: 144 cells/uL (ref 15–500)
HEMATOCRIT: 47 % (ref 38.5–50.0)
Hemoglobin: 15.9 g/dL (ref 13.2–17.1)
LYMPHS PCT: 38 %
Lymphs Abs: 2736 cells/uL (ref 850–3900)
MCH: 31.5 pg (ref 27.0–33.0)
MCHC: 33.8 g/dL (ref 32.0–36.0)
MCV: 93.3 fL (ref 80.0–100.0)
MONO ABS: 720 {cells}/uL (ref 200–950)
MONOS PCT: 10 %
MPV: 11.4 fL (ref 7.5–12.5)
NEUTROS PCT: 50 %
Neutro Abs: 3600 cells/uL (ref 1500–7800)
PLATELETS: 192 10*3/uL (ref 140–400)
RBC: 5.04 MIL/uL (ref 4.20–5.80)
RDW: 14.7 % (ref 11.0–15.0)
WBC: 7.2 10*3/uL (ref 4.0–10.5)

## 2016-09-02 LAB — COMPREHENSIVE METABOLIC PANEL
ALT: 24 U/L (ref 9–46)
AST: 22 U/L (ref 10–35)
Albumin: 4.2 g/dL (ref 3.6–5.1)
Alkaline Phosphatase: 117 U/L — ABNORMAL HIGH (ref 40–115)
BILIRUBIN TOTAL: 0.8 mg/dL (ref 0.2–1.2)
BUN: 13 mg/dL (ref 7–25)
CALCIUM: 9.3 mg/dL (ref 8.6–10.3)
CO2: 28 mmol/L (ref 20–31)
Chloride: 100 mmol/L (ref 98–110)
Creat: 1.13 mg/dL (ref 0.70–1.18)
GLUCOSE: 100 mg/dL — AB (ref 65–99)
Potassium: 4.1 mmol/L (ref 3.5–5.3)
Sodium: 140 mmol/L (ref 135–146)
Total Protein: 7.3 g/dL (ref 6.1–8.1)

## 2016-09-02 MED ORDER — SILDENAFIL CITRATE 20 MG PO TABS: 20.0000 mg | ORAL_TABLET | Freq: Three times a day (TID) | ORAL | 0 refills | Status: DC

## 2016-09-02 NOTE — Patient Instructions (Signed)
  Mr. Veronica , Thank you for taking time to come for your Medicare Wellness Visit. I appreciate your ongoing commitment to your health goals. Please review the following plan we discussed and let me know if I can assist you in the future.   These are the goals we discussed: Goals    None      This is a list of the screening recommended for you and due dates:  Health Maintenance  Topic Date Due  . Pneumonia vaccines (2 of 2 - PPSV23) 07/19/2014  . Flu Shot  01/27/2016  . Colon Cancer Screening  07/02/2017  . Tetanus Vaccine  07/17/2022  .  Hepatitis C: One time screening is recommended by Center for Disease Control  (CDC) for  adults born from [birth year] through 1967.   Completed

## 2016-09-02 NOTE — Progress Notes (Signed)
Martin Dickerson is a 71 y.o. male who presents for annual wellness visit and follow-up on chronic medical conditions.  He has the following concerns: He does have underlying ASHD and is followed by Dr. Acie Fredrickson regularly. He does not complain of chest pain, shortness of breath, PND. He is also seeing Dr. Donnetta Hutching in follow-up on his AAA. He continues on his statin as well as blood pressure medications and is having no difficulty with them. He does have underlying ED and expresses a desire to try something. His reflux is under good control and he uses the medicine on an as-needed basis for that. Otherwise he has no particular concerns or complaints. Family and social history as well as health maintenance and immunizations were reviewed and listed below   Immunizations and Health Maintenance Immunization History  Administered Date(s) Administered  . DTaP 04/06/2002  . Influenza Split 03/22/2011, 03/13/2012  . Influenza Whole 04/06/2002, 04/26/2007  . Influenza-Unspecified 03/28/2013, 03/28/2014  . Pneumococcal Conjugate-13 07/19/2013  . Pneumococcal Polysaccharide-23 06/05/2007  . Tdap 07/17/2012  . Zoster 06/05/2007   Health Maintenance Due  Topic Date Due  . PNA vac Low Risk Adult (2 of 2 - PPSV23) 07/19/2014  . INFLUENZA VACCINE  01/27/2016    Last colonoscopy: 2009 Last PSA:   never Dentist: doesn't have one Ophtho: Dr. Ma Rings at [place] Exercise: 3x/week for 20 minutes each time.   Other doctors caring for patient include: Nahser Early  Advanced Directives:   None. Information given    Depression screen:  See questionnaire below.     Depression screen [facility] 2/9 09/02/2016 08/11/2015 07/22/2014 07/17/2012  Decreased Interest 0 0 0 0  Down, Depressed, Hopeless 0 0 0 0  PHQ - 2 Score 0 0 0 0    Fall Screen: See Questionaire below.   Fall Risk  09/02/2016 08/11/2015 07/22/2014 07/17/2012  Falls in the past year? No No No No    ADL screen:  See questionnaire below.  Functional Status  Survey: Is the patient deaf or have difficulty hearing?: No Does the patient have difficulty seeing, even when wearing glasses/contacts?: No Does the patient have difficulty concentrating, remembering, or making decisions?: No Does the patient have difficulty walking or climbing stairs?: No Does the patient have difficulty dressing or bathing?: No Does the patient have difficulty doing errands alone such as visiting a doctor's office or shopping?: No   Review of Systems Negative except as above   PHYSICAL EXAM:  General Appearance: Alert, cooperative, no distress, appears stated age Head: Normocephalic, without obvious abnormality, atraumatic Eyes: PERRL, conjunctiva/corneas clear, EOM's intact, fundi benign Ears: Normal TM's and external ear canals Nose: Nares normal, mucosa normal, no drainage or sinus   tenderness Throat: Lips, mucosa, and tongue normal; teeth and gums normal Neck: Supple, no lymphadenopathy, thyroid:no enlargement/tenderness/nodules; no carotid bruit or JVD Lungs: Clear to auscultation bilaterally without wheezes, rales or ronchi; respirations unlabored Heart: Regular rate and rhythm, S1 and S2 normal, no murmur, rub or gallop Abdomen: Soft, non-tender, nondistended, normoactive bowel sounds, no masses, no hepatosplenomegaly Extremities: No clubbing, cyanosis or edema Pulses: 2+ and symmetric all extremities Skin: Skin color, texture, turgor normal, actinic lesions present on the face. Lymph nodes: Cervical, supraclavicular, and axillary nodes normal Neurologic: CNII-XII intact, normal strength, sensation and gait; reflexes 2+ and symmetric throughout   Psych: Normal mood, affect, hygiene and grooming  ASSESSMENT/PLAN: Actinic keratosis - Plan: Ambulatory referral to Dermatology  ASHD (arteriosclerotic heart disease) - Plan: CBC with Differential/Platelet, Comprehensive metabolic panel, Lipid  panel  Essential hypertension - Plan: CBC with  Differential/Platelet, Comprehensive metabolic panel  Hyperlipidemia LDL goal <70 - Plan: Lipid panel  Gastroesophageal reflux disease without esophagitis  AAA (abdominal aortic aneurysm) without rupture (HCC)     Recommended at least 30 minutes of aerobic activity at least 5 days/week;  healthy diet and alcohol recommendations (less than or equal to 2 drinks/day) reviewed; regular seatbelt use;  Immunization recommendations discussed.  Colonoscopy recommendations reviewed.   Medicare Attestation I have personally reviewed: The patient's medical and social history Their use of alcohol, tobacco or illicit drugs Their current medications and supplements The patient's functional ability including ADLs,fall risks, home safety risks, cognitive, and hearing and visual impairment Diet and physical activities Evidence for depression or mood disorders  The patient's weight, height, and BMI have been recorded in the chart.  I have made referrals, counseling, and provided education to the patient based on review of the above and I have provided the patient with a written personalized care plan for preventive services.     Wyatt Haste, MD   09/02/2016

## 2016-09-10 DIAGNOSIS — D225 Melanocytic nevi of trunk: Secondary | ICD-10-CM | POA: Diagnosis not present

## 2016-09-10 DIAGNOSIS — L814 Other melanin hyperpigmentation: Secondary | ICD-10-CM | POA: Diagnosis not present

## 2016-09-10 DIAGNOSIS — L570 Actinic keratosis: Secondary | ICD-10-CM | POA: Diagnosis not present

## 2016-09-10 DIAGNOSIS — L821 Other seborrheic keratosis: Secondary | ICD-10-CM | POA: Diagnosis not present

## 2016-09-10 DIAGNOSIS — D1801 Hemangioma of skin and subcutaneous tissue: Secondary | ICD-10-CM | POA: Diagnosis not present

## 2016-09-28 ENCOUNTER — Other Ambulatory Visit: Payer: Self-pay | Admitting: Family Medicine

## 2017-01-19 ENCOUNTER — Telehealth: Payer: Self-pay | Admitting: Family Medicine

## 2017-01-19 ENCOUNTER — Other Ambulatory Visit: Payer: Self-pay

## 2017-01-19 DIAGNOSIS — I10 Essential (primary) hypertension: Secondary | ICD-10-CM

## 2017-01-19 DIAGNOSIS — I2510 Atherosclerotic heart disease of native coronary artery without angina pectoris: Secondary | ICD-10-CM

## 2017-01-19 MED ORDER — METOPROLOL TARTRATE 25 MG PO TABS: 25.0000 mg | ORAL_TABLET | Freq: Two times a day (BID) | ORAL | 0 refills | Status: DC

## 2017-01-19 NOTE — Telephone Encounter (Signed)
Sent in # 12 0 refills per pt request

## 2017-01-19 NOTE — Telephone Encounter (Signed)
ok 

## 2017-01-19 NOTE — Telephone Encounter (Signed)
Pt called and stated that he has ordered refills on metoprolol tartrate and they are not going to be in for several days. He is requesting 12 pills be sent into a local pharmacy, [place] on [address]. Pt can be reached at [phone]

## 2017-02-11 ENCOUNTER — Other Ambulatory Visit: Payer: Self-pay

## 2017-02-11 DIAGNOSIS — I714 Abdominal aortic aneurysm, without rupture, unspecified: Secondary | ICD-10-CM

## 2017-03-25 ENCOUNTER — Other Ambulatory Visit: Payer: Self-pay | Admitting: Vascular Surgery

## 2017-03-29 ENCOUNTER — Ambulatory Visit
Admission: RE | Admit: 2017-03-29 | Discharge: 2017-03-29 | Disposition: A | Discharge status: Home / Self Care | Payer: Commercial Managed Care - HMO | Source: Ambulatory Visit | Attending: Vascular Surgery | Admitting: Vascular Surgery

## 2017-03-29 ENCOUNTER — Ambulatory Visit: Payer: Commercial Managed Care - HMO | Admitting: Vascular Surgery

## 2017-03-29 DIAGNOSIS — I714 Abdominal aortic aneurysm, without rupture, unspecified: Secondary | ICD-10-CM

## 2017-03-29 DIAGNOSIS — K449 Diaphragmatic hernia without obstruction or gangrene: Secondary | ICD-10-CM | POA: Diagnosis not present

## 2017-03-29 MED ORDER — IOPAMIDOL (ISOVUE-370) INJECTION 76%
75.0000 mL | Freq: Once | INTRAVENOUS | Status: AC | PRN
  Administered 2017-03-29: 75 mL via INTRAVENOUS

## 2017-05-31 ENCOUNTER — Encounter: Payer: Self-pay | Admitting: Vascular Surgery

## 2017-05-31 ENCOUNTER — Ambulatory Visit: Payer: Medicare HMO | Admitting: Vascular Surgery

## 2017-05-31 VITALS — BP 121/73 | HR 73 | Temp 97.0°F | Resp 20 | Ht 72.0 in | Wt 249.0 lb

## 2017-05-31 DIAGNOSIS — I714 Abdominal aortic aneurysm, without rupture, unspecified: Secondary | ICD-10-CM

## 2017-05-31 NOTE — Progress Notes (Signed)
[facility] [place]  Patient name: Martin Dickerson MRN: [id number] DOB: 03-02-1946 Sex: male  REASON FOR VISIT: Follow up stent graft repair abdominal aortic aneurysm  HPI: ABDOUL ENCINAS is a 71 y.o. male here today for follow-up.  Is status post stent graft repair of infrarenal abdominal aortic aneurysm.  This will be 5 years in February 2019.  His last study showed a persistent type II endoleak with some suggestion of possible increased size of his native aneurysm sac.  He is seen today for follow-up.  He has no new medical problems.  He does report occasional shortness of breath with exertion but otherwise is able to do his routine activities with no difficulty.  He has no symptoms referable to his aneurysm and no peripheral vascular occlusive disease symptoms  Past Medical History:  Diagnosis Date  . AAA (abdominal aortic aneurysm) ([place]) 2007  . ASHD (arteriosclerotic heart disease)   . ED (erectile dysfunction)   . Emphysema     a "Touch" per pt  . GERD (gastroesophageal reflux disease)    takes Zantac nightly  . Hemorrhoids   . History of colon polyps   . Hyperlipidemia    takes Lipitor daily  . Hypertension    takes Metoprolol and HCTZ daily  . Myocardial infarction [facility]) 2002   has never had to take Nitroglycerin  . Obesity   . PONV (postoperative nausea and vomiting)   . SVT (supraventricular tachycardia) ([facility])     Family History  Problem Relation Age of Onset  . Stroke Mother   . Hypertension Father   . Heart disease Maternal Uncle     SOCIAL HISTORY: Social History   Tobacco Use  . Smoking status: Former Smoker    Years: 35.00    Types: Cigarettes    Last attempt to quit: 06/28/2000    Years since quitting: 16.9  . Smokeless tobacco: Never Used  . Tobacco comment: quit in 2002  Substance Use Topics  . Alcohol use: No    No Known Allergies  Current Outpatient Medications  Medication Sig Dispense  Refill  . aspirin EC 81 MG tablet Take 81 mg by mouth daily.      Marland Kitchen atorvastatin (LIPITOR) 20 MG tablet TAKE 1 TABLET EVERY DAY AT BEDTIME 90 tablet 3  . hydrochlorothiazide (HYDRODIURIL) 25 MG tablet TAKE 1 TABLET EVERY DAY (NEED MD APPOINTMENT) 90 tablet 3  . metoprolol tartrate (LOPRESSOR) 25 MG tablet Take 1 tablet (25 mg total) by mouth 2 (two) times daily. 12 tablet 0  . NITROSTAT 0.4 MG SL tablet DISSOLVE ONE TABLET UNDER THE TONGUE EVERY 5 MINUTES AS NEEDED FOR CHEST PAIN.  DO NOT EXCEED A TOTAL OF 3 DOSES IN 15 MINUTES 25 tablet 2  . potassium chloride (K-DUR,KLOR-CON) 10 MEQ tablet TAKE 1 TABLET EVERY DAY (NEED APPOINTMENT FOR FURTHER REFILLS) 90 tablet 3  . ranitidine (ZANTAC) 150 MG tablet TAKE 1 TABLET AT BEDTIME 90 tablet 3  . sildenafil (REVATIO) 20 MG tablet Take 1 tablet (20 mg total) by mouth 3 (three) times daily. 20 tablet 0  . nitroGLYCERIN (NITROSTAT) 0.4 MG SL tablet DISSOLVE 1 TABLET UNDER THE TONGUE EVERY 5 MINUTES AS NEEDED (Patient not taking: Reported on 05/31/2017) 75 tablet 0   No current facility-administered medications for this visit.     REVIEW OF SYSTEMS:  [X]  denotes positive finding, [ ]  denotes negative finding Cardiac  Comments:  Chest pain or chest pressure:    Shortness  of breath upon exertion: x   Short of breath when lying flat:    Irregular heart rhythm:        Vascular    Pain in calf, thigh, or hip brought on by ambulation:    Pain in feet at night that wakes you up from your sleep:     Blood clot in your veins:    Leg swelling:           PHYSICAL EXAM: Vitals:   05/31/17 1000  BP: 121/73  Pulse: 73  Resp: 20  Temp: (!) 97 F (36.1 C)  TempSrc: Oral  SpO2: 93%  Weight: 249 lb (112.9 kg)  Height: 6' (1.829 m)    GENERAL: The patient is a well-nourished male, in no acute distress. The vital signs are documented above. CARDIOVASCULAR: 2+ radial and 2+ dorsalis pedis pulses.  Abdomen soft and nontender and no end PULMONARY: There  is good air exchange  MUSCULOSKELETAL: There are no major deformities or cyanosis. NEUROLOGIC: No focal weakness or paresthesias are detected. SKIN: There are no ulcers or rashes noted. PSYCHIATRIC: The patient has a normal affect.  DATA:  CT scan from October 2018 shows a plate millimeter decrease and is maximal and CT 2 years ago.  He does have a persistent type II endoleak.  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  Explained the ideal situation size.  Would recommend CT scan in 2 years for continued follow-up of his left common iliac artery aneurysm.  He will see [place] again at that time    Rosetta Posner, MD [facility] Tel [phone] Pager [phone]

## 2017-06-08 ENCOUNTER — Other Ambulatory Visit: Payer: Self-pay | Admitting: Family Medicine

## 2017-06-08 DIAGNOSIS — I2510 Atherosclerotic heart disease of native coronary artery without angina pectoris: Secondary | ICD-10-CM

## 2017-06-08 DIAGNOSIS — I10 Essential (primary) hypertension: Secondary | ICD-10-CM

## 2017-06-08 DIAGNOSIS — E785 Hyperlipidemia, unspecified: Secondary | ICD-10-CM

## 2017-06-29 ENCOUNTER — Other Ambulatory Visit: Payer: Self-pay | Admitting: Family Medicine

## 2017-11-11 ENCOUNTER — Encounter: Payer: Self-pay | Admitting: Family Medicine

## 2017-11-11 ENCOUNTER — Ambulatory Visit (INDEPENDENT_AMBULATORY_CARE_PROVIDER_SITE_OTHER): Payer: Medicare HMO | Admitting: Family Medicine

## 2017-11-11 VITALS — BP 130/78 | HR 63 | Temp 97.9°F | Ht 71.5 in | Wt 252.8 lb

## 2017-11-11 DIAGNOSIS — K219 Gastro-esophageal reflux disease without esophagitis: Secondary | ICD-10-CM | POA: Diagnosis not present

## 2017-11-11 DIAGNOSIS — N529 Male erectile dysfunction, unspecified: Secondary | ICD-10-CM

## 2017-11-11 DIAGNOSIS — E785 Hyperlipidemia, unspecified: Secondary | ICD-10-CM | POA: Diagnosis not present

## 2017-11-11 DIAGNOSIS — Z1211 Encounter for screening for malignant neoplasm of colon: Secondary | ICD-10-CM

## 2017-11-11 DIAGNOSIS — I2510 Atherosclerotic heart disease of native coronary artery without angina pectoris: Secondary | ICD-10-CM

## 2017-11-11 DIAGNOSIS — I10 Essential (primary) hypertension: Secondary | ICD-10-CM | POA: Diagnosis not present

## 2017-11-11 DIAGNOSIS — Z0000 Encounter for general adult medical examination without abnormal findings: Secondary | ICD-10-CM

## 2017-11-11 LAB — COMPREHENSIVE METABOLIC PANEL
A/G RATIO: 1.4 (ref 1.2–2.2)
ALBUMIN: 4.2 g/dL (ref 3.5–4.8)
ALK PHOS: 134 IU/L — AB (ref 39–117)
ALT: 28 IU/L (ref 0–44)
AST: 23 IU/L (ref 0–40)
BILIRUBIN TOTAL: 0.9 mg/dL (ref 0.0–1.2)
BUN / CREAT RATIO: 9 — AB (ref 10–24)
BUN: 11 mg/dL (ref 8–27)
CO2: 26 mmol/L (ref 20–29)
Calcium: 9.8 mg/dL (ref 8.6–10.2)
Chloride: 99 mmol/L (ref 96–106)
Creatinine, Ser: 1.24 mg/dL (ref 0.76–1.27)
GFR calc Af Amer: 66 mL/min/{1.73_m2} (ref >59)
GFR calc non Af Amer: 57 mL/min/{1.73_m2} — ABNORMAL LOW (ref >59)
GLUCOSE: 106 mg/dL — AB (ref 65–99)
Globulin, Total: 3 g/dL (ref 1.5–4.5)
POTASSIUM: 5.2 mmol/L (ref 3.5–5.2)
Sodium: 141 mmol/L (ref 134–144)
Total Protein: 7.2 g/dL (ref 6.0–8.5)

## 2017-11-11 LAB — CBC WITH DIFFERENTIAL/PLATELET
BASOS ABS: 0 10*3/uL (ref 0.0–0.2)
Basos: 0 %
EOS (ABSOLUTE): 0.2 10*3/uL (ref 0.0–0.4)
Eos: 3 %
Hematocrit: 48.5 % (ref 37.5–51.0)
Hemoglobin: 16.5 g/dL (ref 13.0–17.7)
Immature Grans (Abs): 0 10*3/uL (ref 0.0–0.1)
Immature Granulocytes: 0 %
LYMPHS ABS: 2.7 10*3/uL (ref 0.7–3.1)
Lymphs: 37 %
MCH: 32.2 pg (ref 26.6–33.0)
MCHC: 34 g/dL (ref 31.5–35.7)
MCV: 95 fL (ref 79–97)
Monocytes Absolute: 0.8 10*3/uL (ref 0.1–0.9)
Monocytes: 11 %
Neutrophils Absolute: 3.6 10*3/uL (ref 1.4–7.0)
Neutrophils: 49 %
PLATELETS: 197 10*3/uL (ref 150–379)
RBC: 5.13 x10E6/uL (ref 4.14–5.80)
RDW: 14.8 % (ref 12.3–15.4)
WBC: 7.3 10*3/uL (ref 3.4–10.8)

## 2017-11-11 LAB — LIPID PANEL
Chol/HDL Ratio: 3.7 ratio (ref 0.0–5.0)
Cholesterol, Total: 157 mg/dL (ref 100–199)
HDL: 43 mg/dL (ref >39)
LDL CALC: 82 mg/dL (ref 0–99)
Triglycerides: 161 mg/dL — ABNORMAL HIGH (ref 0–149)
VLDL Cholesterol Cal: 32 mg/dL (ref 5–40)

## 2017-11-11 MED ORDER — SILDENAFIL CITRATE 20 MG PO TABS: 20.0000 mg | ORAL_TABLET | Freq: Three times a day (TID) | ORAL | 0 refills | Status: DC

## 2017-11-11 NOTE — Progress Notes (Signed)
Martin Dickerson is a 72 y.o. male who presents for annual wellness visit and follow-up on chronic medical conditions.  He has no particular concerns or complaints.  He continues on Lipitor without difficulty.  He was seen in December by Dr. Donnetta Hutching for follow-up on his AAA repair.  He has had no chest pain, shortness of breath, PND or DOE.  He has never used his NTG .he did have a CABG x4 and was seen in the past by Dr. Acie Fredrickson.  He continues on HCTZ as well as metoprolol.  He does have reflux disease and intermittently uses Zantac.  He would like a refill on his sildenafil.   Immunizations and Health Maintenance Immunization History  Administered Date(s) Administered  . DTaP 04/06/2002  . Influenza Split 03/22/2011, 03/13/2012  . Influenza Whole 04/06/2002, 04/26/2007  . Influenza-Unspecified 03/28/2013, 03/28/2014  . Pneumococcal Conjugate-13 07/19/2013  . Pneumococcal Polysaccharide-23 06/05/2007  . Tdap 07/17/2012  . Zoster 06/05/2007   Health Maintenance Due  Topic Date Due  . PNA vac Low Risk Adult (2 of 2 - PPSV23) 07/19/2014  . COLONOSCOPY  07/02/2017    Last colonoscopy: 10 years Last PSA: last year Dentist: over 10 yrs Ophtho:8 months ago Exercise: walking  Other doctors caring for patient include:Early. [place] Advanced Directives: No.  Information given Would patient like information on creating a medical advance directive?: Yes (MAU/Ambulatory/Procedural Areas - Information given)  Depression screen:  See questionnaire below.     Depression screen [facility] 2/9 11/11/2017 09/02/2016 08/11/2015 07/22/2014 07/17/2012  Decreased Interest 0 0 0 0 0  Down, Depressed, Hopeless 0 0 0 0 0  PHQ - 2 Score 0 0 0 0 0    Fall Screen: See Questionaire below.   Fall Risk  11/11/2017 09/02/2016 08/11/2015 07/22/2014 07/17/2012  Falls in the past year? No No No No No    ADL screen:  See questionnaire below.  Functional Status Survey: Is the patient deaf or have difficulty hearing?: No Does the  patient have difficulty seeing, even when wearing glasses/contacts?: No Does the patient have difficulty concentrating, remembering, or making decisions?: No Does the patient have difficulty walking or climbing stairs?: No Does the patient have difficulty dressing or bathing?: No Does the patient have difficulty doing errands alone such as visiting a doctor's office or shopping?: No   Review of Systems  Constitutional: -, -unexpected weight change, -anorexia, -fatigue Allergy: -sneezing, -itching, -congestion Dermatology: denies changing moles, rash, lumps ENT: -runny nose, -ear pain, -sore throat,  Cardiology:  -chest pain, -palpitations, -orthopnea, Respiratory: -cough, -shortness of breath, -dyspnea on exertion, -wheezing,  Gastroenterology: -abdominal pain, -nausea, -vomiting, -diarrhea, -constipation, -dysphagia Hematology: -bleeding or bruising problems Musculoskeletal: -arthralgias, -myalgias, -joint swelling, -back pain, - Ophthalmology: -vision changes,  Urology: -dysuria, -difficulty urinating,  -urinary frequency, -urgency, incontinence Neurology: -, -numbness, , -memory loss, -falls, -dizziness    PHYSICAL EXAM:  BP 130/78 (BP Location: Left Arm, Patient Position: Sitting)   Pulse 63   Temp 97.9 F (36.6 C)   Ht 5' 11.5" (1.816 m)   Wt 252 lb 12.8 oz (114.7 kg)   SpO2 99%   BMI 34.77 kg/m   General Appearance: Alert, cooperative, no distress, appears stated age Head: Normocephalic, without obvious abnormality, atraumatic Eyes: PERRL, conjunctiva/corneas clear, EOM's intact, fundi benign Ears: Normal TM's and external ear canals Nose: Nares normal, mucosa normal, no drainage or sinus   tenderness Throat: Lips, mucosa, and tongue normal; teeth and gums normal Neck: Supple, no lymphadenopathy, thyroid:no enlargement/tenderness/nodules; no carotid  bruit or JVD Lungs: Clear to auscultation bilaterally without wheezes, rales or ronchi; respirations unlabored Heart:  Regular rate and rhythm, S1 and S2 normal, no murmur, rub or gallop Abdomen: Soft, non-tender, nondistended, normoactive bowel sounds, no masses, no hepatosplenomegaly Extremities: No clubbing, cyanosis or edema Pulses: 2+ and symmetric all extremities Skin: Skin color, texture, turgor normal, no rashes or lesions Lymph nodes: Cervical, supraclavicular, and axillary nodes normal Neurologic: CNII-XII intact, normal strength, sensation and gait; reflexes 2+ and symmetric throughout   Psych: Normal mood, affect, hygiene and grooming EKG is unchanged from previous tracing ASSESSMENT/PLAN: ASHD (arteriosclerotic heart disease) - Plan: EKG 12-Lead, CBC with Differential/Platelet, Comprehensive metabolic panel, Lipid panel  Essential hypertension - Plan: EKG 12-Lead, CBC with Differential/Platelet, Comprehensive metabolic panel  Hyperlipidemia LDL goal <70 - Plan: Lipid panel  Gastroesophageal reflux disease without esophagitis  Erectile dysfunction, unspecified erectile dysfunction type - Plan: sildenafil (REVATIO) 20 MG tablet  Screening for colon cancer - Plan: Cologuard  I encouraged him to continue to take good care of himself.  Also recommend he go to the drugstore to get Shingrix.    Medicare Attestation I have personally reviewed: The patient's medical and social history Their use of alcohol, tobacco or illicit drugs Their current medications and supplements The patient's functional ability including ADLs,fall risks, home safety risks, cognitive, and hearing and visual impairment Diet and physical activities Evidence for depression or mood disorders  The patient's weight, height, and BMI have been recorded in the chart.  I have made referrals, counseling, and provided education to the patient based on review of the above and I have provided the patient with a written personalized care plan for preventive services.     Jill Alexanders, MD   11/11/2017

## 2017-11-11 NOTE — Patient Instructions (Signed)
  [name] , Thank you for taking time to come for your Medicare Wellness Visit. I appreciate your ongoing commitment to your health goals. Please review the following plan we discussed and let me know if I can assist you in the future.   These are the goals we discussed: Goals    None      This is a list of the screening recommended for you and due dates:  Health Maintenance  Topic Date Due  . Pneumonia vaccines (2 of 2 - PPSV23) 07/19/2014  . Colon Cancer Screening  07/02/2017  . Flu Shot  01/26/2018  . Tetanus Vaccine  07/17/2022  .  Hepatitis C: One time screening is recommended by Center for Disease Control  (CDC) for  adults born from [birth year] through 1967.   Completed

## 2017-11-23 ENCOUNTER — Encounter: Payer: Self-pay | Admitting: Family Medicine

## 2017-11-24 DIAGNOSIS — Z1211 Encounter for screening for malignant neoplasm of colon: Secondary | ICD-10-CM | POA: Diagnosis not present

## 2017-11-24 LAB — COLOGUARD: Cologuard: NEGATIVE

## 2017-12-09 ENCOUNTER — Telehealth: Payer: Self-pay

## 2017-12-09 NOTE — Telephone Encounter (Signed)
called pt to let him know cologuard is negative. [place]

## 2018-04-17 ENCOUNTER — Other Ambulatory Visit: Payer: Self-pay | Admitting: Family Medicine

## 2018-04-17 DIAGNOSIS — I10 Essential (primary) hypertension: Secondary | ICD-10-CM

## 2018-04-17 DIAGNOSIS — I2510 Atherosclerotic heart disease of native coronary artery without angina pectoris: Secondary | ICD-10-CM

## 2018-04-17 DIAGNOSIS — E785 Hyperlipidemia, unspecified: Secondary | ICD-10-CM

## 2018-08-24 ENCOUNTER — Telehealth: Payer: Self-pay | Admitting: Family Medicine

## 2018-08-24 MED ORDER — PANTOPRAZOLE SODIUM 40 MG PO TBEC
40.0000 mg | DELAYED_RELEASE_TABLET | Freq: Every day | ORAL | 3 refills | Status: DC
Start: 2018-08-24 — End: 2018-11-28

## 2018-08-24 NOTE — Telephone Encounter (Signed)
Pt states ins will no longer cover Ranitidine & would like to switch to preferred, Pantoprazole or Omeprazole, he doesn't have a preference, said hasn't tried either.  Please sent to [facility] mail order for 90 days

## 2018-11-28 ENCOUNTER — Encounter: Payer: Self-pay | Admitting: Family Medicine

## 2018-11-28 ENCOUNTER — Ambulatory Visit (INDEPENDENT_AMBULATORY_CARE_PROVIDER_SITE_OTHER): Payer: Medicare HMO | Admitting: Family Medicine

## 2018-11-28 ENCOUNTER — Other Ambulatory Visit: Payer: Self-pay

## 2018-11-28 VITALS — BP 134/80 | HR 69 | Temp 97.6°F | Ht 72.0 in | Wt 264.4 lb

## 2018-11-28 DIAGNOSIS — Z9889 Other specified postprocedural states: Secondary | ICD-10-CM

## 2018-11-28 DIAGNOSIS — N5201 Erectile dysfunction due to arterial insufficiency: Secondary | ICD-10-CM | POA: Diagnosis not present

## 2018-11-28 DIAGNOSIS — E785 Hyperlipidemia, unspecified: Secondary | ICD-10-CM

## 2018-11-28 DIAGNOSIS — I2510 Atherosclerotic heart disease of native coronary artery without angina pectoris: Secondary | ICD-10-CM

## 2018-11-28 DIAGNOSIS — Z87891 Personal history of nicotine dependence: Secondary | ICD-10-CM | POA: Diagnosis not present

## 2018-11-28 DIAGNOSIS — K219 Gastro-esophageal reflux disease without esophagitis: Secondary | ICD-10-CM | POA: Diagnosis not present

## 2018-11-28 DIAGNOSIS — Z8679 Personal history of other diseases of the circulatory system: Secondary | ICD-10-CM | POA: Diagnosis not present

## 2018-11-28 DIAGNOSIS — I10 Essential (primary) hypertension: Secondary | ICD-10-CM

## 2018-11-28 MED ORDER — NITROGLYCERIN 0.4 MG SL SUBL: SUBLINGUAL_TABLET | SUBLINGUAL | 0 refills | Status: DC

## 2018-11-28 MED ORDER — METOPROLOL TARTRATE 25 MG PO TABS: 25.0000 mg | ORAL_TABLET | Freq: Two times a day (BID) | ORAL | 3 refills | Status: DC

## 2018-11-28 MED ORDER — PANTOPRAZOLE SODIUM 40 MG PO TBEC: 40.0000 mg | DELAYED_RELEASE_TABLET | Freq: Every day | ORAL | 3 refills | Status: DC

## 2018-11-28 MED ORDER — HYDROCHLOROTHIAZIDE 25 MG PO TABS: ORAL_TABLET | ORAL | 3 refills | Status: DC

## 2018-11-28 MED ORDER — POTASSIUM CHLORIDE CRYS ER 10 MEQ PO TBCR: EXTENDED_RELEASE_TABLET | ORAL | 3 refills | Status: DC

## 2018-11-28 MED ORDER — ATORVASTATIN CALCIUM 20 MG PO TABS: 20.0000 mg | ORAL_TABLET | Freq: Every day | ORAL | 3 refills | Status: DC

## 2018-11-28 MED ORDER — TADALAFIL 20 MG PO TABS: 20.0000 mg | ORAL_TABLET | Freq: Every day | ORAL | 1 refills | Status: DC | PRN

## 2018-11-28 NOTE — Progress Notes (Signed)
Martin Dickerson is a 73 y.o. male who presents for annual wellness visit and follow-up on chronic medical conditions.  He has no particular concerns or questions.  He has had no difficulty with chest pain, shortness of breath, PND or DOE.  He has not had the need to use NTG in several years.  He continues on metoprolol, HCTZ and also taking atorvastatin for his cholesterol.  He is taking Protonix regularly and is now also taking ranitidine on an as-needed basis to keep his reflux under good control.  He does have an underlying history of ADD and got no real benefit from using sildenafil. He has also started an exercise program using a treadmill. Past medical, social and family history reviewed  Immunizations and Health Maintenance Immunization History  Administered Date(s) Administered  . DTaP 04/06/2002  . Influenza Split 03/22/2011, 03/13/2012  . Influenza Whole 04/06/2002, 04/26/2007  . Influenza-Unspecified 03/28/2013, 03/28/2014  . Pneumococcal Conjugate-13 07/19/2013  . Pneumococcal Polysaccharide-23 06/05/2007  . Tdap 07/17/2012  . Zoster 06/05/2007   Health Maintenance Due  Topic Date Due  . PNA vac Low Risk Adult (2 of 2 - PPSV23) 07/19/2014    Last colonoscopy:Cologuard 11-24-17  Last UYQ:IHKVQQV Dentist: over ten years Ophtho: 9 months ago Exercise: walking  Other doctors caring for patient include: Dr. Cathie Olden cardio. Dr. Donnetta Hutching Vasc surgery,  Advanced Directives:no info given Does Patient Have a Medical Advance Directive?: No Would patient like information on creating a medical advance directive?: No - Patient declined  Depression screen:  See questionnaire below.     Depression screen [facility] 2/9 11/28/2018 11/11/2017 09/02/2016 08/11/2015 07/22/2014  Decreased Interest 0 0 0 0 0  Down, Depressed, Hopeless 0 0 0 0 0  PHQ - 2 Score 0 0 0 0 0    Fall Screen: See Questionaire below.   Fall Risk  11/28/2018 11/11/2017 09/02/2016 08/11/2015 07/22/2014  Falls in the past year? 0 No No No  No    ADL screen:  See questionnaire below.  Functional Status Survey: Is the patient deaf or have difficulty hearing?: No Does the patient have difficulty seeing, even when wearing glasses/contacts?: No Does the patient have difficulty concentrating, remembering, or making decisions?: No Does the patient have difficulty walking or climbing stairs?: No Does the patient have difficulty dressing or bathing?: No Does the patient have difficulty doing errands alone such as visiting a doctor's office or shopping?: No   Review of Systems  Constitutional: -, -unexpected weight change, -anorexia, -fatigue Allergy: -sneezing, -itching, -congestion Dermatology: denies changing moles, rash, lumps ENT: -runny nose, -ear pain, -sore throat,  Cardiology:  -chest pain, -palpitations, -orthopnea, Respiratory: -cough, -shortness of breath, -dyspnea on exertion, -wheezing,  Gastroenterology: -abdominal pain, -nausea, -vomiting, -diarrhea, -constipation, -dysphagia Hematology: -bleeding or bruising problems Musculoskeletal: -arthralgias, -myalgias, -joint swelling, -back pain, - Ophthalmology: -vision changes,  Urology: -dysuria, -difficulty urinating,  -urinary frequency, -urgency, incontinence Neurology: -, -numbness, , -memory loss, -falls, -dizziness    PHYSICAL EXAM:  BP 134/80 (BP Location: Left Arm, Patient Position: Sitting)   Pulse 69   Temp 97.6 F (36.4 C)   Ht 6' (1.829 m)   Wt 264 lb 6.4 oz (119.9 kg)   SpO2 96%   BMI 35.86 kg/m   General Appearance: Alert, cooperative, no distress, appears stated age Head: Normocephalic, without obvious abnormality, atraumatic Eyes: PERRL, conjunctiva/corneas clear, EOM's intact, fundi benign Ears: Normal TM's and external ear canals Nose: Nares normal, mucosa normal, no drainage or sinus   tenderness Throat: Lips,  mucosa, and tongue normal; teeth and gums normal Neck: Supple, no lymphadenopathy, thyroid:no enlargement/tenderness/nodules;  no carotid bruit or JVD Lungs: Clear to auscultation bilaterally without wheezes, rales or ronchi; respirations unlabored Heart: Regular rate and rhythm, S1 and S2 normal, no murmur, rub or gallop Abdomen: Soft, non-tender, nondistended, normoactive bowel sounds, no masses, no hepatosplenomegaly Extremities: No clubbing, cyanosis or edema Pulses: 2+ and symmetric all extremities Skin: Skin color, texture, turgor normal, no rashes or lesions Lymph nodes: Cervical, supraclavicular, and axillary nodes normal Neurologic: CNII-XII intact, normal strength, sensation and gait; reflexes 2+ and symmetric throughout   Psych: Normal mood, affect, hygiene and grooming  ASSESSMENT/PLAN: S/P AAA repair  Former smoker  ASHD (arteriosclerotic heart disease) - Plan: metoprolol tartrate (LOPRESSOR) 25 MG tablet, nitroGLYCERIN (NITROSTAT) 0.4 MG SL tablet, CBC with Differential/Platelet, Comprehensive metabolic panel, Lipid panel  Gastroesophageal reflux disease without esophagitis - Plan: pantoprazole (PROTONIX) 40 MG tablet  Essential hypertension - Plan: metoprolol tartrate (LOPRESSOR) 25 MG tablet, hydrochlorothiazide (HYDRODIURIL) 25 MG tablet, potassium chloride (K-DUR) 10 MEQ tablet, CBC with Differential/Platelet, Comprehensive metabolic panel  Hyperlipidemia LDL goal <70 - Plan: atorvastatin (LIPITOR) 20 MG tablet, Lipid panel  Erectile dysfunction due to arterial insufficiency - Plan: tadalafil (CIALIS) 20 MG tablet Continue on present medication regimen.  Also discussed the use of Cialis as well as possible side effects.  He will keep me informed. , recommended at least 30 minutes of aerobic activity at least 5 days/week; proper sunscreen use reviewed; healthy diet and alcohol recommendations (less than or equal to 2 drinks/day) reviewed;Immunization recommendations discussed.    Medicare Attestation I have personally reviewed: The patient's medical and social history Their use of alcohol,  tobacco or illicit drugs Their current medications and supplements The patient's functional ability including ADLs,fall risks, home safety risks, cognitive, and hearing and visual impairment Diet and physical activities Evidence for depression or mood disorders  The patient's weight, height, and BMI have been recorded in the chart.  I have made referrals, counseling, and provided education to the patient based on review of the above and I have provided the patient with a written personalized care plan for preventive services.     Jill Alexanders, MD   11/28/2018

## 2018-11-29 LAB — CBC WITH DIFFERENTIAL/PLATELET
Basophils Absolute: 0.1 10*3/uL (ref 0.0–0.2)
Basos: 1 %
EOS (ABSOLUTE): 0.2 10*3/uL (ref 0.0–0.4)
Eos: 3 %
Hematocrit: 46.2 % (ref 37.5–51.0)
Hemoglobin: 16.1 g/dL (ref 13.0–17.7)
Immature Grans (Abs): 0 10*3/uL (ref 0.0–0.1)
Immature Granulocytes: 1 %
Lymphocytes Absolute: 2.9 10*3/uL (ref 0.7–3.1)
Lymphs: 38 %
MCH: 32 pg (ref 26.6–33.0)
MCHC: 34.8 g/dL (ref 31.5–35.7)
MCV: 92 fL (ref 79–97)
Monocytes Absolute: 0.6 10*3/uL (ref 0.1–0.9)
Monocytes: 8 %
Neutrophils Absolute: 3.8 10*3/uL (ref 1.4–7.0)
Neutrophils: 49 %
Platelets: 178 10*3/uL (ref 150–450)
RBC: 5.03 x10E6/uL (ref 4.14–5.80)
RDW: 13.6 % (ref 11.6–15.4)
WBC: 7.7 10*3/uL (ref 3.4–10.8)

## 2018-11-29 LAB — LIPID PANEL
Chol/HDL Ratio: 3.5 ratio (ref 0.0–5.0)
Cholesterol, Total: 157 mg/dL (ref 100–199)
HDL: 45 mg/dL (ref >39)
LDL Calculated: 83 mg/dL (ref 0–99)
Triglycerides: 143 mg/dL (ref 0–149)
VLDL Cholesterol Cal: 29 mg/dL (ref 5–40)

## 2018-11-29 LAB — COMPREHENSIVE METABOLIC PANEL
ALT: 33 IU/L (ref 0–44)
AST: 28 IU/L (ref 0–40)
Albumin/Globulin Ratio: 1.4 (ref 1.2–2.2)
Albumin: 4.2 g/dL (ref 3.7–4.7)
Alkaline Phosphatase: 117 IU/L (ref 39–117)
BUN/Creatinine Ratio: 9 — ABNORMAL LOW (ref 10–24)
BUN: 11 mg/dL (ref 8–27)
Bilirubin Total: 1 mg/dL (ref 0.0–1.2)
CO2: 22 mmol/L (ref 20–29)
Calcium: 9.4 mg/dL (ref 8.6–10.2)
Chloride: 97 mmol/L (ref 96–106)
Creatinine, Ser: 1.28 mg/dL — ABNORMAL HIGH (ref 0.76–1.27)
GFR calc Af Amer: 63 mL/min/{1.73_m2} (ref >59)
GFR calc non Af Amer: 54 mL/min/{1.73_m2} — ABNORMAL LOW (ref >59)
Globulin, Total: 2.9 g/dL (ref 1.5–4.5)
Glucose: 89 mg/dL (ref 65–99)
Potassium: 4.4 mmol/L (ref 3.5–5.2)
Sodium: 140 mmol/L (ref 134–144)
Total Protein: 7.1 g/dL (ref 6.0–8.5)

## 2019-01-19 ENCOUNTER — Other Ambulatory Visit: Payer: Self-pay

## 2019-01-19 ENCOUNTER — Encounter: Payer: Self-pay | Admitting: Family Medicine

## 2019-01-19 ENCOUNTER — Ambulatory Visit
Admission: RE | Admit: 2019-01-19 | Discharge: 2019-01-19 | Disposition: A | Discharge status: Home / Self Care | Payer: Medicare HMO | Source: Ambulatory Visit | Attending: Family Medicine | Admitting: Family Medicine

## 2019-01-19 ENCOUNTER — Ambulatory Visit (INDEPENDENT_AMBULATORY_CARE_PROVIDER_SITE_OTHER): Payer: Medicare HMO | Admitting: Family Medicine

## 2019-01-19 VITALS — BP 122/76 | HR 71 | Temp 98.2°F | Wt 263.6 lb

## 2019-01-19 DIAGNOSIS — M545 Low back pain, unspecified: Secondary | ICD-10-CM

## 2019-01-19 DIAGNOSIS — M5136 Other intervertebral disc degeneration, lumbar region: Secondary | ICD-10-CM | POA: Diagnosis not present

## 2019-01-19 NOTE — Progress Notes (Signed)
   Subjective:    Patient ID: FED CECI, male    DOB: 1945-07-27, 73 y.o.   MRN: [id number]  HPI He complains of a 6-day history of left-sided hip pain that is worse with motion.  He states that it sometimes goes down into the thigh area.  No numbness, tingling, weakness, bowel or bladder problems.  He has been taking 2 Aleve per day.  No previous history of difficulty with his back.     Review of Systems     Objective:   Physical Exam Normal lumbar curve and motion.  He points to the upper SI joint area as to where the most discomfort is.  No pain over sciatic notch.  Negative straight leg raising.  DTRs are normal.        Assessment & Plan:  Acute left-sided low back pain without sciatica - Plan: DG Lumbar Spine Complete, he is to use 2 Aleve twice per day as well as Tylenol.  If the x-ray is negative, he was instructed to return here if the pain continues or gets worse after roughly 10 to 14 days.  He was comfortable with that.

## 2019-01-23 ENCOUNTER — Telehealth: Payer: Self-pay

## 2019-01-23 NOTE — Telephone Encounter (Signed)
Pt called for his x-ray results please advise [facility]

## 2019-01-24 ENCOUNTER — Telehealth: Payer: Self-pay | Admitting: Family Medicine

## 2019-01-24 NOTE — Telephone Encounter (Signed)
Pt called and I notified him of Xray results but pt stated that he is still in pain and wants to know what needs to be done.

## 2019-01-24 NOTE — Telephone Encounter (Signed)
The x-ray showed basic degenerative changes.  Nothing of any major concern.

## 2019-01-24 NOTE — Telephone Encounter (Signed)
Pt says he is taking tylenol due to aleve not working. Pt is also alternating ice and heat and still has throbbing pain . Pt says while  he was walking  to bathroom his inner thigh was burning. Please advise if we need to see him back or referral to ortho. [place]

## 2019-01-24 NOTE — Telephone Encounter (Signed)
Make sure he is taking the Aleve as I instructed and if that is not working , I will call in a different medication.  My comment was to see him in about 10 to 14 days if he still having trouble.

## 2019-01-25 MED ORDER — DICLOFENAC SODIUM 75 MG PO TBEC: 75.0000 mg | DELAYED_RELEASE_TABLET | Freq: Two times a day (BID) | ORAL | 0 refills | Status: DC

## 2019-01-25 NOTE — Telephone Encounter (Signed)
Let him know that I called in a different pain medication and if he still having problems setting up for Monday or Tuesday to be reevaluated to see if we can then get an MRI on him

## 2019-01-25 NOTE — Telephone Encounter (Signed)
Pt was advised and appt was made. KH °

## 2019-01-29 ENCOUNTER — Ambulatory Visit (INDEPENDENT_AMBULATORY_CARE_PROVIDER_SITE_OTHER): Payer: Medicare HMO | Admitting: Family Medicine

## 2019-01-29 ENCOUNTER — Encounter: Payer: Self-pay | Admitting: Family Medicine

## 2019-01-29 ENCOUNTER — Other Ambulatory Visit: Payer: Self-pay

## 2019-01-29 VITALS — BP 134/80 | HR 71 | Temp 97.3°F | Wt 262.8 lb

## 2019-01-29 DIAGNOSIS — M545 Low back pain, unspecified: Secondary | ICD-10-CM

## 2019-01-29 MED ORDER — TRAMADOL HCL 50 MG PO TABS: 50.0000 mg | ORAL_TABLET | Freq: Three times a day (TID) | ORAL | 0 refills | Status: AC | PRN

## 2019-01-29 NOTE — Progress Notes (Signed)
   Subjective:    Patient ID: Martin Dickerson, male    DOB: 1946-05-20, 73 y.o.   MRN: [id number]  HPI He is here for evaluation of continued difficulty with left-sided back pain.  He now states that when he extends his back, he will feel a tingling sensation in the anterior upper thigh area.  Otherwise any motion of his back causes no difficulty.  Previous x-rays did show some changes in the upper lumbar area.  He also states that the diclofenac has not had any benefit.   Review of Systems     Objective:   Physical Exam Alert and in no distress.  Flexion of the back was entirely normal however extension did cause a burning sensation in the anterior upper thigh area.  Full motion of the hip without pain.       Assessment & Plan:  Acute left-sided low back pain without sciatica - Plan: traMADol (ULTRAM) 50 MG tablet, Ambulatory referral to Orthopedic Surgery, He is not interested in getting codeine so I will try him on Ultram. His symptoms are suggestive of a neural foraminal issue rather than possible disc.

## 2019-01-30 ENCOUNTER — Ambulatory Visit (INDEPENDENT_AMBULATORY_CARE_PROVIDER_SITE_OTHER): Payer: Medicare HMO | Admitting: Physician Assistant

## 2019-01-30 ENCOUNTER — Encounter: Payer: Self-pay | Admitting: Physician Assistant

## 2019-01-30 DIAGNOSIS — M5440 Lumbago with sciatica, unspecified side: Secondary | ICD-10-CM

## 2019-01-30 MED ORDER — HYDROCODONE-ACETAMINOPHEN 5-325 MG PO TABS: 1.0000 | ORAL_TABLET | Freq: Four times a day (QID) | ORAL | 0 refills | Status: DC | PRN

## 2019-01-30 MED ORDER — METHYLPREDNISOLONE 4 MG PO TABS: ORAL_TABLET | ORAL | 0 refills | Status: AC

## 2019-01-30 MED ORDER — CYCLOBENZAPRINE HCL 10 MG PO TABS: 5.0000 mg | ORAL_TABLET | Freq: Every day | ORAL | 0 refills | Status: DC

## 2019-01-30 NOTE — Telephone Encounter (Signed)
Pt came in yesterday [facility] ([facility]

## 2019-01-30 NOTE — Progress Notes (Signed)
Office Visit Note   Patient: Martin Dickerson           Date of Birth: 05-20-1946           MRN: [id number] Visit Date: 01/30/2019              Requested by: Denita Lung, MD [place],  [place] [address] PCP: Denita Lung, MD   Assessment & Plan: Visit Diagnoses:  1. Low back pain with sciatica, sciatica laterality unspecified, unspecified back pain laterality, unspecified chronicity     Plan:  We will send her to physical therapy for core strengthening, stretching: Home exercise program: And modalities.  Placed him on Medrol Dosepak no NSAIDs while on Dosepak and this is discussed with patient at length.  Flexeril 5 mg 1 at night.  He is given Norco for pain which he is to use sparingly.  We will see him back in 1 month check his response.  He will call sooner if his condition becomes worse we can always obtain an MRI to rule out HNP as source of pain. Follow-Up Instructions: Return in about 4 weeks (around 02/27/2019).   Orders:  No orders of the defined types were placed in this encounter.  Meds ordered this encounter  Medications  . methylPREDNISolone (MEDROL) 4 MG tablet    Sig: Take as directed    Dispense:  21 tablet    Refill:  0  . cyclobenzaprine (FLEXERIL) 10 MG tablet    Sig: Take 0.5 tablets (5 mg total) by mouth at bedtime.    Dispense:  20 tablet    Refill:  0  . HYDROcodone-acetaminophen (NORCO) 5-325 MG tablet    Sig: Take 1 tablet by mouth every 6 (six) hours as needed for moderate pain. One to two tabs every 4-6 hours for pain    Dispense:  20 tablet    Refill:  0      Procedures: No procedures performed   Clinical Data: No additional findings.   Subjective: Chief Complaint  Patient presents with  . Lower Back - Pain    HPI Martin Dickerson is a 73 year old were seen for low back pain which been ongoing for the last few weeks.  He states that he began his back pain 2 weeks ago and then over the last week he is developed a burning  sensation he points to the anterior medial thigh.  He denies any saddle anesthesia like symptoms.  Denies any bowel bladder dysfunction.  Denies any waking pain.  He is tried tramadol and a heating pad.  Is never had back pain before.  He reports he was picking up a case of water whenever he had low back pain. Radiographs of his lumbar spine are personally reviewed and showed no acute fracture.  No spondylolisthesis.  Endplate spurring at L2-4 and L3-4.  Status post stent graft repair of the abdominal aorta.  Review of Systems Negative for fevers or chills.  Please see HPI otherwise negative or noncontributory.  Objective: Vital Signs: There were no vitals taken for this visit.  Physical Exam Constitutional:      Appearance: He is not ill-appearing.  Cardiovascular:     Pulses: Normal pulses.  Pulmonary:     Effort: Pulmonary effort is normal.  Neurological:     Mental Status: He is alert and oriented to person, place, and time.     Ortho Exam Lower extremities 5 5 strength throughout against resistance.  Negative straight leg raise  bilaterally tight hamstrings bilaterally.  He has tenderness over the lower lumbar spinal column.  Deep tendon reflexes are 2+ at knees and ankles and equal and symmetric.  Good range of motion bilateral hips without pain.  He has limited extension of the lumbar spine near full flexion coming within 2 inches of touching his toes.  Specialty Comments:  No specialty comments available.  Imaging: No results found.   PMFS History: Patient Active Problem List   Diagnosis Date Noted  . Erectile dysfunction due to arterial insufficiency 08/11/2015  . Former smoker 08/11/2015  . S/P AAA repair 10/03/2012  . ASHD (arteriosclerotic heart disease) 03/22/2011  . Hypertension 03/22/2011  . Hyperlipidemia LDL goal <70 03/22/2011  . GERD (gastroesophageal reflux disease) 03/22/2011   Past Medical History:  Diagnosis Date  . AAA (abdominal aortic aneurysm)  ([place]) 2007  . ASHD (arteriosclerotic heart disease)   . ED (erectile dysfunction)   . Emphysema     a "Touch" per pt  . GERD (gastroesophageal reflux disease)    takes Zantac nightly  . Hemorrhoids   . History of colon polyps   . Hyperlipidemia    takes Lipitor daily  . Hypertension    takes Metoprolol and HCTZ daily  . Myocardial infarction [facility]) 2002   has never had to take Nitroglycerin  . Obesity   . PONV (postoperative nausea and vomiting)   . SVT (supraventricular tachycardia) (HCC)     Family History  Problem Relation Age of Onset  . Stroke Mother   . Hypertension Father   . Heart disease Maternal Uncle     Past Surgical History:  Procedure Laterality Date  . ABDOMINAL AORTIC ENDOVASCULAR STENT GRAFT N/A 08/28/2012   Procedure: ABDOMINAL AORTIC ENDOVASCULAR STENT GRAFT;  Surgeon: Rosetta Posner, MD;  Location: [place];  Service: Vascular;  Laterality: N/A;  Ultrasound guided  . CARDIAC CATHETERIZATION  03/22/2001   EF 40%  . CARDIOVASCULAR STRESS TEST  01/31/2007   EF 68% NO EVIDENCE OF ISCHEMIA  . COLONOSCOPY    . 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 ERCP    . US ECHOCARDIOGRAPHY  10/26/2005   EF 55-60%   Social History   Occupational History  . Not on file  Tobacco Use  . Smoking status: Former Smoker    Years: 35.00    Types: Cigarettes    Quit date: 06/28/2000    Years since quitting: 18.6  . Smokeless tobacco: Never Used  . Tobacco comment: quit in 2002  Substance and Sexual Activity  . Alcohol use: No  . Drug use: No  . Sexual activity: Yes

## 2019-01-30 NOTE — Telephone Encounter (Signed)
Kim I think this was coming to you

## 2019-02-02 ENCOUNTER — Telehealth: Payer: Self-pay | Admitting: Physician Assistant

## 2019-02-02 NOTE — Telephone Encounter (Signed)
Holding for Artis Delay to address Patient just received #20 on 08/04.

## 2019-02-02 NOTE — Telephone Encounter (Signed)
Patient called needing Rx refilled (Hydrocodone) The number to contact patient is [phone]

## 2019-02-05 ENCOUNTER — Telehealth: Payer: Self-pay | Admitting: Physician Assistant

## 2019-02-05 ENCOUNTER — Other Ambulatory Visit: Payer: Self-pay | Admitting: Physician Assistant

## 2019-02-05 ENCOUNTER — Other Ambulatory Visit: Payer: Self-pay

## 2019-02-05 DIAGNOSIS — M4807 Spinal stenosis, lumbosacral region: Secondary | ICD-10-CM

## 2019-02-05 MED ORDER — HYDROCODONE-ACETAMINOPHEN 5-325 MG PO TABS: 1.0000 | ORAL_TABLET | Freq: Four times a day (QID) | ORAL | 0 refills | Status: DC | PRN

## 2019-02-05 NOTE — Telephone Encounter (Signed)
Sent order for stat MRI since he is in so much pain

## 2019-02-05 NOTE — Telephone Encounter (Signed)
Pt called in requesting a call back from gil or Caryl Pina said he is in a lot of pain and didn't want to tell me cause then he'd have to repeat himself.   [phone]

## 2019-02-05 NOTE — Telephone Encounter (Signed)
Done - called in  

## 2019-02-05 NOTE — Telephone Encounter (Signed)
Please advise 

## 2019-02-06 ENCOUNTER — Other Ambulatory Visit: Payer: Self-pay | Admitting: Physician Assistant

## 2019-02-07 ENCOUNTER — Other Ambulatory Visit: Payer: Medicare HMO

## 2019-03-03 ENCOUNTER — Other Ambulatory Visit: Payer: Self-pay

## 2019-03-03 ENCOUNTER — Ambulatory Visit
Admission: RE | Admit: 2019-03-03 | Discharge: 2019-03-03 | Disposition: A | Discharge status: Home / Self Care | Payer: Medicare HMO | Source: Ambulatory Visit | Attending: Physician Assistant | Admitting: Physician Assistant

## 2019-03-03 DIAGNOSIS — M5127 Other intervertebral disc displacement, lumbosacral region: Secondary | ICD-10-CM | POA: Diagnosis not present

## 2019-03-03 DIAGNOSIS — M4807 Spinal stenosis, lumbosacral region: Secondary | ICD-10-CM

## 2019-03-06 ENCOUNTER — Other Ambulatory Visit: Payer: Medicare HMO

## 2019-03-07 ENCOUNTER — Other Ambulatory Visit: Payer: Medicare HMO

## 2019-03-12 ENCOUNTER — Encounter: Payer: Self-pay | Admitting: Physician Assistant

## 2019-03-12 ENCOUNTER — Ambulatory Visit (INDEPENDENT_AMBULATORY_CARE_PROVIDER_SITE_OTHER): Payer: Medicare HMO | Admitting: Physician Assistant

## 2019-03-12 VITALS — Ht 71.75 in | Wt 258.0 lb

## 2019-03-12 DIAGNOSIS — M5440 Lumbago with sciatica, unspecified side: Secondary | ICD-10-CM

## 2019-03-12 NOTE — Progress Notes (Signed)
HPI: [name] returns today to go over the MRI of his lumbar spine.  He states he is overall doing well.  He feels that the muscle relaxer definitely helped.  He is been doing some home exercises for his back.  Main complaint is still some pain in the left thigh. Lumbar MRI images are reviewed with the patient.  L2-L3 mild there is a bulge with bilateral facet arthrosis resulting in subarticular recess stenosis right greater than left.  No significant spinal canal stenosis.  L3-4 mild disc bulge with bilateral facet arthrosis resulting in mild right foraminal stenosis no canal stenosis.  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  Plan: Offered facet injection.  Patient defers.  He will continue to use home exercise program.  Follow-up with [place] on a as needed basis.  Encourage weight loss.  If symptoms become worse he can always call the office and we can get him set up for facet injections versus foraminal injection with Dr. Ernestina Patches.

## 2019-04-01 ENCOUNTER — Other Ambulatory Visit: Payer: Self-pay | Admitting: Family Medicine

## 2019-04-01 DIAGNOSIS — E785 Hyperlipidemia, unspecified: Secondary | ICD-10-CM

## 2019-04-07 ENCOUNTER — Other Ambulatory Visit: Payer: Self-pay | Admitting: Family Medicine

## 2019-04-07 DIAGNOSIS — I10 Essential (primary) hypertension: Secondary | ICD-10-CM

## 2019-04-07 DIAGNOSIS — I2510 Atherosclerotic heart disease of native coronary artery without angina pectoris: Secondary | ICD-10-CM

## 2019-06-12 ENCOUNTER — Other Ambulatory Visit: Payer: Self-pay | Admitting: Vascular Surgery

## 2019-06-12 DIAGNOSIS — Z8679 Personal history of other diseases of the circulatory system: Secondary | ICD-10-CM

## 2019-06-12 DIAGNOSIS — Z9889 Other specified postprocedural states: Secondary | ICD-10-CM

## 2019-07-03 ENCOUNTER — Ambulatory Visit: Payer: Medicare HMO | Admitting: Vascular Surgery

## 2019-07-20 ENCOUNTER — Other Ambulatory Visit: Payer: Self-pay

## 2019-07-20 DIAGNOSIS — I998 Other disorder of circulatory system: Secondary | ICD-10-CM

## 2019-07-24 ENCOUNTER — Ambulatory Visit: Payer: Medicare HMO

## 2019-07-25 ENCOUNTER — Other Ambulatory Visit: Payer: Self-pay | Admitting: Family Medicine

## 2019-07-25 DIAGNOSIS — K219 Gastro-esophageal reflux disease without esophagitis: Secondary | ICD-10-CM

## 2019-08-02 ENCOUNTER — Ambulatory Visit: Payer: Medicare HMO | Attending: Internal Medicine

## 2019-08-02 DIAGNOSIS — Z23 Encounter for immunization: Secondary | ICD-10-CM | POA: Insufficient documentation

## 2019-08-02 NOTE — Progress Notes (Signed)
   Covid-19 Vaccination Clinic  Name:  Martin Dickerson    MRN: [id number] DOB: 09-28-1945  08/02/2019  Mr. Sole was observed post Covid-19 immunization for 15 minutes without incidence. He was provided with Vaccine Information Sheet and instruction to access the V-Safe system.   Mr. Carles was instructed to call 911 with any severe reactions post vaccine: Marland Kitchen Difficulty breathing  . Swelling of your face and throat  . A fast heartbeat  . A bad rash all over your body  . Dizziness and weakness    Immunizations Administered    Name Date Dose VIS Date Route   Pfizer COVID-19 Vaccine 08/02/2019  2:11 PM 0.3 mL 06/08/2019 Intramuscular   Manufacturer: [place]   Lot: [id number]   [place]: [id number]

## 2019-08-09 ENCOUNTER — Other Ambulatory Visit: Payer: Self-pay | Admitting: *Deleted

## 2019-08-09 ENCOUNTER — Other Ambulatory Visit: Payer: Self-pay | Admitting: Vascular Surgery

## 2019-08-09 ENCOUNTER — Ambulatory Visit
Admission: RE | Admit: 2019-08-09 | Discharge: 2019-08-09 | Disposition: A | Discharge status: Home / Self Care | Payer: Medicare HMO | Source: Ambulatory Visit | Attending: Vascular Surgery | Admitting: Vascular Surgery

## 2019-08-09 ENCOUNTER — Other Ambulatory Visit: Payer: Self-pay

## 2019-08-09 DIAGNOSIS — Z87898 Personal history of other specified conditions: Secondary | ICD-10-CM

## 2019-08-09 DIAGNOSIS — I779 Disorder of arteries and arterioles, unspecified: Secondary | ICD-10-CM | POA: Diagnosis not present

## 2019-08-09 DIAGNOSIS — I998 Other disorder of circulatory system: Secondary | ICD-10-CM

## 2019-08-09 LAB — CREATININE WITH EST GFR
Creat: 1.16 mg/dL (ref 0.70–1.18)
GFR, Est African American: 71 mL/min/{1.73_m2} (ref >=60)
GFR, Est Non African American: 61 mL/min/{1.73_m2} (ref >=60)

## 2019-08-09 LAB — BUN: BUN: 12 mg/dL (ref 7–25)

## 2019-08-14 ENCOUNTER — Other Ambulatory Visit: Payer: Medicare HMO

## 2019-08-14 ENCOUNTER — Ambulatory Visit: Payer: Medicare HMO | Admitting: Vascular Surgery

## 2019-08-15 ENCOUNTER — Ambulatory Visit
Admission: RE | Admit: 2019-08-15 | Discharge: 2019-08-15 | Disposition: A | Discharge status: Home / Self Care | Payer: Medicare HMO | Source: Ambulatory Visit | Attending: Vascular Surgery | Admitting: Vascular Surgery

## 2019-08-15 DIAGNOSIS — I714 Abdominal aortic aneurysm, without rupture: Secondary | ICD-10-CM | POA: Diagnosis not present

## 2019-08-15 DIAGNOSIS — Z452 Encounter for adjustment and management of vascular access device: Secondary | ICD-10-CM | POA: Diagnosis not present

## 2019-08-15 DIAGNOSIS — I998 Other disorder of circulatory system: Secondary | ICD-10-CM

## 2019-08-15 DIAGNOSIS — Z87898 Personal history of other specified conditions: Secondary | ICD-10-CM

## 2019-08-15 HISTORY — PX: IR US GUIDE VASC ACCESS LEFT: IMG2389

## 2019-08-15 MED ORDER — IOPAMIDOL (ISOVUE-370) INJECTION 76%
75.0000 mL | Freq: Once | INTRAVENOUS | Status: AC | PRN
  Administered 2019-08-15: 15:00:00 75 mL via INTRAVENOUS

## 2019-08-15 NOTE — Progress Notes (Signed)
PROCEDURE SUMMARY:  Successful placement of 21 gauge vascular access sheath to the right basilic vein. No complications Capped Ready for use. EBL = trace  Please see full dictation in Imaging section for details.   Judie Grieve Lunetta Marina PA-C 08/15/2019 2:47 PM

## 2019-08-24 ENCOUNTER — Telehealth: Payer: Self-pay | Admitting: Family Medicine

## 2019-08-24 NOTE — Telephone Encounter (Signed)
Pt called and wanted a handicap placard. He stated [place] and had to walk quit a bit and it wore him out. Pt was out of breath as he spoke to me. Handicap form sent back to you.

## 2019-08-24 NOTE — Telephone Encounter (Signed)
He needs an office visit not virtual

## 2019-08-27 ENCOUNTER — Ambulatory Visit: Payer: Medicare HMO | Attending: Internal Medicine

## 2019-08-27 DIAGNOSIS — Z23 Encounter for immunization: Secondary | ICD-10-CM

## 2019-08-27 NOTE — Telephone Encounter (Signed)
appt was made KH 

## 2019-08-27 NOTE — Progress Notes (Signed)
   Covid-19 Vaccination Clinic  Name:  DONTRA STONEKING    MRN: [id number] DOB: 1946-02-14  08/27/2019  Mr. Roseberry was observed post Covid-19 immunization for 15 minutes without incidence. He was provided with Vaccine Information Sheet and instruction to access the V-Safe system.   Mr. Polman was instructed to call 911 with any severe reactions post vaccine: Marland Kitchen Difficulty breathing  . Swelling of your face and throat  . A fast heartbeat  . A bad rash all over your body  . Dizziness and weakness    Immunizations Administered    Name Date Dose VIS Date Route   Pfizer COVID-19 Vaccine 08/27/2019  4:33 PM 0.3 mL 06/08/2019 Intramuscular   Manufacturer: [place]   Lot: [id number]   [place]: [id number]

## 2019-08-28 ENCOUNTER — Other Ambulatory Visit: Payer: Self-pay

## 2019-08-28 ENCOUNTER — Ambulatory Visit (INDEPENDENT_AMBULATORY_CARE_PROVIDER_SITE_OTHER): Payer: Medicare HMO | Admitting: Family Medicine

## 2019-08-28 ENCOUNTER — Encounter: Payer: Self-pay | Admitting: Family Medicine

## 2019-08-28 VITALS — Temp 96.7°F | Wt 258.0 lb

## 2019-08-28 DIAGNOSIS — I2510 Atherosclerotic heart disease of native coronary artery without angina pectoris: Secondary | ICD-10-CM | POA: Diagnosis not present

## 2019-08-28 NOTE — Progress Notes (Signed)
   Subjective:    Patient ID: Martin Dickerson, male    DOB: 10-12-1945, 74 y.o.   MRN: [id number]  HPI Documentation for virtual telephone encounter. Interactive audio and video telecommunications were attempted between this provider and patient, however he did not have access to video capability.  We continued and completed visit with audio only. The patient was located at home. The provider was located in the office. The patient did consent to this visit and is aware of possible charges through their insurance for this visit. The other persons participating in this telemedicine service were none. Time spent on call was 2 minutes and in review of previous records >8 minutes total. This virtual service is not related to other E/M service within previous 7 days. [place] and had to walk up a slight incline which made him slightly short of breath.  Normally he can walk on a flat surface and have no difficulty with shortness of breath.  He does have underlying heart disease .  He has no history of PND or orthopnea.   Review of Systems     Objective:   Physical Exam Alert and in no distress otherwise not examined       Assessment & Plan:  ASHD (arteriosclerotic heart disease) I explained that under the circumstances he would not qualify to get a handicap sticker.  He was comfortable with that recognizing that normally he has no difficulty with shortness of breath.  I will see him in June.

## 2019-08-30 ENCOUNTER — Telehealth (HOSPITAL_COMMUNITY): Payer: Self-pay

## 2019-08-30 NOTE — Telephone Encounter (Signed)

## 2019-09-04 ENCOUNTER — Ambulatory Visit: Payer: Medicare HMO | Admitting: Vascular Surgery

## 2019-09-04 ENCOUNTER — Encounter: Payer: Self-pay | Admitting: Vascular Surgery

## 2019-09-04 ENCOUNTER — Other Ambulatory Visit: Payer: Self-pay

## 2019-09-04 VITALS — BP 129/80 | HR 86 | Temp 97.8°F | Resp 20 | Ht 71.75 in | Wt 267.0 lb

## 2019-09-04 DIAGNOSIS — Z8679 Personal history of other diseases of the circulatory system: Secondary | ICD-10-CM | POA: Diagnosis not present

## 2019-09-04 DIAGNOSIS — Z9889 Other specified postprocedural states: Secondary | ICD-10-CM | POA: Diagnosis not present

## 2019-09-04 NOTE — Progress Notes (Signed)
[facility] [place]  Patient name: Martin Dickerson MRN: [id number] DOB: February 09, 1946 Sex: male  REASON FOR VISIT: Follow-up stent graft repair abdominal aortic aneurysm  HPI: Martin Dickerson is a 74 y.o. male here today for follow-up.  He is status post stent graft repair abdominal otic aneurysm in 2014.  He remains quite active and has had no new major medical difficulties.  He has no symptoms referable to his aneurysm.  He has no symptoms of peripheral vascular occlusive disease.  Past Medical History:  Diagnosis Date  . AAA (abdominal aortic aneurysm) ([place]) 2007  . ASHD (arteriosclerotic heart disease)   . ED (erectile dysfunction)   . Emphysema     a "Touch" per pt  . GERD (gastroesophageal reflux disease)    takes Zantac nightly  . Hemorrhoids   . History of colon polyps   . Hyperlipidemia    takes Lipitor daily  . Hypertension    takes Metoprolol and HCTZ daily  . Myocardial infarction [facility] 2002   has never had to take Nitroglycerin  . Obesity   . PONV (postoperative nausea and vomiting)   . SVT (supraventricular tachycardia) (HCC)     Family History  Problem Relation Age of Onset  . Stroke Mother   . Hypertension Father   . Heart disease Maternal Uncle     SOCIAL HISTORY: Social History   Tobacco Use  . Smoking status: Former Smoker    Years: 35.00    Types: Cigarettes    Quit date: 06/28/2000    Years since quitting: 19.1  . Smokeless tobacco: Never Used  . Tobacco comment: quit in 2002  Substance Use Topics  . Alcohol use: No    No Known Allergies  Current Outpatient Medications  Medication Sig Dispense Refill  . aspirin EC 81 MG tablet Take 81 mg by mouth daily.      Marland Kitchen atorvastatin (LIPITOR) 20 MG tablet TAKE 1 TABLET AT BEDTIME 90 tablet 3  . cyclobenzaprine (FLEXERIL) 10 MG tablet Take 0.5 tablets (5 mg total) by mouth at bedtime. 20 tablet 0  . hydrochlorothiazide (HYDRODIURIL) 25 MG tablet  TAKE 1 TABLET EVERY DAY (NEED MD APPOINTMENT) 90 tablet 3  . HYDROcodone-acetaminophen (NORCO) 5-325 MG tablet Take 1 tablet by mouth every 6 (six) hours as needed for moderate pain. One to two tabs every 4-6 hours for pain 20 tablet 0  . methylPREDNISolone (MEDROL) 4 MG tablet Take as directed 21 tablet 0  . metoprolol tartrate (LOPRESSOR) 25 MG tablet TAKE 1 TABLET TWICE DAILY 180 tablet 3  . nitroGLYCERIN (NITROSTAT) 0.4 MG SL tablet DISSOLVE 1 TABLET UNDER THE TONGUE EVERY 5 MINUTES AS NEEDED 75 tablet 0  . pantoprazole (PROTONIX) 40 MG tablet TAKE 1 TABLET (40 MG TOTAL) BY MOUTH DAILY. 90 tablet 3  . potassium chloride (KLOR-CON) 10 MEQ tablet TAKE 1 TABLET EVERY DAY (NEED APPOINTMENT FOR FURTHER REFILLS) 90 tablet 3  . tadalafil (CIALIS) 20 MG tablet Take 1 tablet (20 mg total) by mouth daily as needed for erectile dysfunction. 5 tablet 10  . tadalafil (CIALIS) 20 MG tablet Take 1 tablet (20 mg total) by mouth daily as needed for erectile dysfunction. 10 tablet 1  . ranitidine (ZANTAC) 150 MG tablet TAKE 1 TABLET AT BEDTIME (Patient not taking: Reported on 08/28/2019) 90 tablet 3   No current facility-administered medications for this visit.    REVIEW OF SYSTEMS:  [X]  denotes positive finding, [ ]  denotes negative finding Cardiac  Comments:  Chest pain or chest pressure:    Shortness of breath upon exertion:    Short of breath when lying flat:    Irregular heart rhythm:        Vascular    Pain in calf, thigh, or hip brought on by ambulation:    Pain in feet at night that wakes you up from your sleep:     Blood clot in your veins:    Leg swelling:           PHYSICAL EXAM: Vitals:   09/04/19 0926  BP: 129/80  Pulse: 86  Resp: 20  Temp: 97.8 F (36.6 C)  SpO2: 98%  Weight: 121.1 kg  Height: 5' 11.75" (1.822 m)    GENERAL: The patient is a well-nourished male, in no acute distress. The vital signs are documented above. CARDIOVASCULAR: 2+ radial 2+ dorsalis pedis pulses  bilaterally.  Abdomen with moderate obesity.  I do not feel an aneurysm PULMONARY: There is good air exchange  MUSCULOSKELETAL: There are no major deformities or cyanosis. NEUROLOGIC: No focal weakness or paresthesias are detected. SKIN: There are no ulcers or rashes noted. PSYCHIATRIC: The patient has a normal affect.  DATA:  I reviewed CT scan from 2 weeks ago with the patient.  Reviewed his actual films.  This shows slightly diminished diameter of his native aneurysm sac around his aorta.  He had a prior small type II endoleak which is now completely resolved.  He has a new finding of a slight amount of eccentric mural thrombus at the upper area of the stent graft.  This in the posterior aspect.  It is not flow-limiting.  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  MEDICAL ISSUES: Stable overall.  I reviewed all these findings with him.  He has had no evidence of expanding of his native aneurysm sac.  I did explain the slight risk of embolization from the mural thrombus in his stent graft and the symptoms that would present themselves and that he should call us should he have any evidence of lower extremity ischemia.  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  We plan to see him again in 2 years with repeat CT scan    Rosetta Posner, MD [facility] Tel [phone] Pager [phone]

## 2020-02-15 ENCOUNTER — Other Ambulatory Visit: Payer: Self-pay | Admitting: Family Medicine

## 2020-02-15 DIAGNOSIS — E785 Hyperlipidemia, unspecified: Secondary | ICD-10-CM

## 2020-05-01 ENCOUNTER — Other Ambulatory Visit: Payer: Self-pay | Admitting: Family Medicine

## 2020-05-01 ENCOUNTER — Telehealth: Payer: Self-pay | Admitting: Family Medicine

## 2020-05-01 ENCOUNTER — Other Ambulatory Visit: Payer: Self-pay

## 2020-05-01 DIAGNOSIS — I2510 Atherosclerotic heart disease of native coronary artery without angina pectoris: Secondary | ICD-10-CM

## 2020-05-01 DIAGNOSIS — I10 Essential (primary) hypertension: Secondary | ICD-10-CM

## 2020-05-01 NOTE — Telephone Encounter (Signed)
Done

## 2020-05-01 NOTE — Telephone Encounter (Signed)
[facility] pharmacy called and said pt is requesting a 2 week supply of metoprolol sent to the [facility] on [address] while they preparing to ship his full order. Pt can be reached at [phone]

## 2020-08-06 ENCOUNTER — Other Ambulatory Visit: Payer: Self-pay | Admitting: Family Medicine

## 2020-08-06 DIAGNOSIS — K219 Gastro-esophageal reflux disease without esophagitis: Secondary | ICD-10-CM

## 2020-08-26 ENCOUNTER — Other Ambulatory Visit: Payer: Self-pay | Admitting: Family Medicine

## 2020-08-26 DIAGNOSIS — I2510 Atherosclerotic heart disease of native coronary artery without angina pectoris: Secondary | ICD-10-CM

## 2020-09-12 ENCOUNTER — Other Ambulatory Visit: Payer: Self-pay | Admitting: Family Medicine

## 2020-09-12 DIAGNOSIS — K219 Gastro-esophageal reflux disease without esophagitis: Secondary | ICD-10-CM

## 2020-10-01 ENCOUNTER — Encounter: Payer: Self-pay | Admitting: Family Medicine

## 2020-10-01 ENCOUNTER — Other Ambulatory Visit: Payer: Self-pay

## 2020-10-01 ENCOUNTER — Ambulatory Visit (INDEPENDENT_AMBULATORY_CARE_PROVIDER_SITE_OTHER): Payer: Medicare HMO | Admitting: Family Medicine

## 2020-10-01 VITALS — BP 136/76 | HR 66 | Temp 96.5°F | Ht 70.25 in | Wt 244.2 lb

## 2020-10-01 DIAGNOSIS — K219 Gastro-esophageal reflux disease without esophagitis: Secondary | ICD-10-CM

## 2020-10-01 DIAGNOSIS — Z8679 Personal history of other diseases of the circulatory system: Secondary | ICD-10-CM

## 2020-10-01 DIAGNOSIS — Z9889 Other specified postprocedural states: Secondary | ICD-10-CM | POA: Diagnosis not present

## 2020-10-01 DIAGNOSIS — E669 Obesity, unspecified: Secondary | ICD-10-CM | POA: Diagnosis not present

## 2020-10-01 DIAGNOSIS — E785 Hyperlipidemia, unspecified: Secondary | ICD-10-CM | POA: Diagnosis not present

## 2020-10-01 DIAGNOSIS — I10 Essential (primary) hypertension: Secondary | ICD-10-CM | POA: Diagnosis not present

## 2020-10-01 DIAGNOSIS — Z0000 Encounter for general adult medical examination without abnormal findings: Secondary | ICD-10-CM

## 2020-10-01 DIAGNOSIS — Z87891 Personal history of nicotine dependence: Secondary | ICD-10-CM

## 2020-10-01 DIAGNOSIS — N5201 Erectile dysfunction due to arterial insufficiency: Secondary | ICD-10-CM | POA: Diagnosis not present

## 2020-10-01 DIAGNOSIS — I2510 Atherosclerotic heart disease of native coronary artery without angina pectoris: Secondary | ICD-10-CM

## 2020-10-01 MED ORDER — POTASSIUM CHLORIDE CRYS ER 10 MEQ PO TBCR: 10.0000 meq | EXTENDED_RELEASE_TABLET | Freq: Every day | ORAL | 3 refills | Status: DC

## 2020-10-01 MED ORDER — HYDROCHLOROTHIAZIDE 25 MG PO TABS: 1.0000 | ORAL_TABLET | Freq: Every day | ORAL | 3 refills | Status: DC

## 2020-10-01 MED ORDER — ATORVASTATIN CALCIUM 20 MG PO TABS: 1.0000 | ORAL_TABLET | Freq: Every day | ORAL | 3 refills | Status: DC

## 2020-10-01 MED ORDER — METOPROLOL TARTRATE 25 MG PO TABS: 25.0000 mg | ORAL_TABLET | Freq: Two times a day (BID) | ORAL | 3 refills | Status: DC

## 2020-10-01 MED ORDER — NITROGLYCERIN 0.4 MG SL SUBL: SUBLINGUAL_TABLET | SUBLINGUAL | 0 refills | Status: DC

## 2020-10-01 MED ORDER — PANTOPRAZOLE SODIUM 40 MG PO TBEC: 40.0000 mg | DELAYED_RELEASE_TABLET | Freq: Every day | ORAL | 0 refills | Status: DC

## 2020-10-01 NOTE — Progress Notes (Signed)
Martin Dickerson is a 75 y.o. male who presents for annual wellness visit and follow-up on chronic medical conditions.  He has no particular concerns or complaints.  He does have an underlying history of ASHD with CABG in 2002.  He has no chest pain, shortness of breath, PND or DOE.  He is also had a AAA repair and is doing fine from that.  He does have some difficulty with low back pain but seems to keep this under pretty good control.  He has started a weight loss and dietary regimen within the last several months.  He continues on Protonix for his reflux on an as-needed basis.  Is also taking HCTZ and metoprolol.  Lipitor is causing no aches or pains.  He is no longer using Cialis.  He states it is not necessary and is not working.  His immunizations were reviewed.   Immunizations and Health Maintenance Immunization History  Administered Date(s) Administered  . DTaP 04/06/2002  . Fluad Quad(high Dose 65+) 03/21/2019  . Influenza Split 03/22/2011, 03/13/2012  . Influenza Whole 04/06/2002, 04/26/2007  . Influenza-Unspecified 03/28/2013, 03/28/2014, 03/21/2019  . PFIZER(Purple Top)SARS-COV-2 Vaccination 08/02/2019, 08/27/2019, 04/08/2020, 09/26/2020  . Pneumococcal Conjugate-13 07/19/2013  . Pneumococcal Polysaccharide-23 06/05/2007  . Tdap 07/17/2012  . Zoster 06/05/2007   There are no preventive care reminders to display for this patient.  Last colonoscopy: 07/03/07 cologuard 11/24/17 Last PSA: N/A Dentist: over four years Ophtho: Q two years Exercise: walking 15 min a day   Other doctors caring for patient include:  Martin Dickerson vas surg. Martin Dickerson cardio.  Advanced Directives: Does Patient Have a Medical Advance Directive?: No Would patient like information on creating a medical advance directive?: Yes (ED - Information included in AVS) Information given on advanced directive as well as paperwork. Depression screen:  See questionnaire below.     Depression screen [facility] 2/9 10/01/2020 11/28/2018  11/11/2017 09/02/2016 08/11/2015  Decreased Interest 0 0 0 0 0  Down, Depressed, Hopeless 0 0 0 0 0  PHQ - 2 Score 0 0 0 0 0    Fall Screen: See Questionaire below.   Fall Risk  10/01/2020 11/28/2018 11/11/2017 09/02/2016 08/11/2015  Falls in the past year? 0 0 No No No  Number falls in past yr: 0 - - - -  Injury with Fall? 0 - - - -  Risk for fall due to : No Fall Risks - - - -  Follow up Falls evaluation completed - - - -    ADL screen:  See questionnaire below.  Functional Status Survey: Is the patient deaf or have difficulty hearing?: No Does the patient have difficulty seeing, even when wearing glasses/contacts?: No Does the patient have difficulty concentrating, remembering, or making decisions?: No Does the patient have difficulty walking or climbing stairs?: No Does the patient have difficulty dressing or bathing?: No Does the patient have difficulty doing errands alone such as visiting a doctor's office or shopping?: No   Review of Systems  Constitutional: -, -unexpected weight change, -anorexia, -fatigue Allergy: -sneezing, -itching, -congestion Dermatology: denies changing moles, rash, lumps ENT: -runny nose, -ear pain, -sore throat,  Cardiology:  -chest pain, -palpitations, -orthopnea, Respiratory: -cough, -shortness of breath, -dyspnea on exertion, -wheezing,  Gastroenterology: -abdominal pain, -nausea, -vomiting, -diarrhea, -constipation, -dysphagia Hematology: -bleeding or bruising problems Musculoskeletal: -arthralgias, -myalgias, -joint swelling, -back pain, - Ophthalmology: -vision changes,  Urology: -dysuria, -difficulty urinating,  -urinary frequency, -urgency, incontinence Neurology: -, -numbness, , -memory loss, -falls, -dizziness EKG was reviewed  by me from 2019.   PHYSICAL EXAM:  General Appearance: Alert, cooperative, no distress, appears stated age Head: Normocephalic, without obvious abnormality, atraumatic Eyes: PERRL, conjunctiva/corneas clear, EOM's  intact,  Ears: Normal TM's and external ear canals Nose: Nares normal, mucosa normal, no drainage or sinus   tenderness Throat: Lips, mucosa, and tongue normal; teeth and gums normal Neck: Supple, no lymphadenopathy, thyroid:no enlargement/tenderness/nodules; no carotid bruit or JVD Lungs: Clear to auscultation bilaterally without wheezes, rales or ronchi; respirations unlabored Heart: Regular rate and rhythm, S1 and S2 normal, no murmur, rub or gallop Abdomen: Soft, non-tender, nondistended, normoactive bowel sounds, no masses, no hepatosplenomegaly Skin: Skin color, texture, turgor normal, no rashes or lesions Lymph nodes: Cervical, supraclavicular, and axillary nodes normal Neurologic: CNII-XII intact, normal strength, sensation and gait; reflexes 2+ and symmetric throughout   Psych: Normal mood, affect, hygiene and grooming  ASSESSMENT/PLAN: Routine general medical examination at a health care facility - Plan: CBC with Differential/Platelet, Comprehensive metabolic panel, Lipid panel  ASHD (arteriosclerotic heart disease) - Plan: CBC with Differential/Platelet, Comprehensive metabolic panel, Lipid panel, metoprolol tartrate (LOPRESSOR) 25 MG tablet, nitroGLYCERIN (NITROSTAT) 0.4 MG SL tablet  Primary hypertension - Plan: CBC with Differential/Platelet, Comprehensive metabolic panel  Hyperlipidemia LDL goal <70 - Plan: Lipid panel, atorvastatin (LIPITOR) 20 MG tablet  Gastroesophageal reflux disease without esophagitis - Plan: pantoprazole (PROTONIX) 40 MG tablet  S/P AAA repair  Erectile dysfunction due to arterial insufficiency  Former smoker  Obesity (BMI 35.0-39.9 without comorbidity)  Essential hypertension - Plan: hydrochlorothiazide (HYDRODIURIL) 25 MG tablet, metoprolol tartrate (LOPRESSOR) 25 MG tablet, potassium chloride (KLOR-CON) 10 MEQ tablet    He was given information on getting Shingrix at the drugstore.  Also discussed diet and exercise with him and encouraged  him to continue with his present regimen.  Discussed possible referral back to cardiology but he would like to defer that. Discussed at least 30 minutes of aerobic activity at least 5 days/week; healthy diet and alcohol recommendations (less than or equal to 2 drinks/day) reviewed;Marland Kitchen Immunization recommendations discussed.  Colonoscopy recommendations reviewed.   Medicare Attestation I have personally reviewed: The patient's medical and social history Their use of alcohol, tobacco or illicit drugs Their current medications and supplements The patient's functional ability including ADLs,fall risks, home safety risks, cognitive, and hearing and visual impairment Diet and physical activities Evidence for depression or mood disorders  The patient's weight, height, and BMI have been recorded in the chart.  I have made referrals, counseling, and provided education to the patient based on review of the above and I have provided the patient with a written personalized care plan for preventive services.     Jill Alexanders, MD   10/01/2020

## 2020-10-02 LAB — COMPREHENSIVE METABOLIC PANEL
ALT: 27 IU/L (ref 0–44)
AST: 25 IU/L (ref 0–40)
Albumin/Globulin Ratio: 1.6 (ref 1.2–2.2)
Albumin: 4.6 g/dL (ref 3.7–4.7)
Alkaline Phosphatase: 110 IU/L (ref 44–121)
BUN/Creatinine Ratio: 10 (ref 10–24)
BUN: 12 mg/dL (ref 8–27)
Bilirubin Total: 1 mg/dL (ref 0.0–1.2)
CO2: 22 mmol/L (ref 20–29)
Calcium: 10 mg/dL (ref 8.6–10.2)
Chloride: 100 mmol/L (ref 96–106)
Creatinine, Ser: 1.15 mg/dL (ref 0.76–1.27)
Globulin, Total: 2.9 g/dL (ref 1.5–4.5)
Glucose: 98 mg/dL (ref 65–99)
Potassium: 5.4 mmol/L — ABNORMAL HIGH (ref 3.5–5.2)
Sodium: 141 mmol/L (ref 134–144)
Total Protein: 7.5 g/dL (ref 6.0–8.5)
eGFR: 66 mL/min/{1.73_m2} (ref >59)

## 2020-10-02 LAB — LIPID PANEL
Chol/HDL Ratio: 3.5 ratio (ref 0.0–5.0)
Cholesterol, Total: 152 mg/dL (ref 100–199)
HDL: 44 mg/dL (ref >39)
LDL Chol Calc (NIH): 83 mg/dL (ref 0–99)
Triglycerides: 143 mg/dL (ref 0–149)
VLDL Cholesterol Cal: 25 mg/dL (ref 5–40)

## 2020-10-02 LAB — CBC WITH DIFFERENTIAL/PLATELET
Basophils Absolute: 0.1 10*3/uL (ref 0.0–0.2)
Basos: 1 %
EOS (ABSOLUTE): 0.2 10*3/uL (ref 0.0–0.4)
Eos: 3 %
Hematocrit: 49.7 % (ref 37.5–51.0)
Hemoglobin: 16.3 g/dL (ref 13.0–17.7)
Immature Grans (Abs): 0 10*3/uL (ref 0.0–0.1)
Immature Granulocytes: 0 %
Lymphocytes Absolute: 3 10*3/uL (ref 0.7–3.1)
Lymphs: 45 %
MCH: 31.3 pg (ref 26.6–33.0)
MCHC: 32.8 g/dL (ref 31.5–35.7)
MCV: 96 fL (ref 79–97)
Monocytes Absolute: 0.6 10*3/uL (ref 0.1–0.9)
Monocytes: 8 %
Neutrophils Absolute: 2.9 10*3/uL (ref 1.4–7.0)
Neutrophils: 43 %
Platelets: 152 10*3/uL (ref 150–450)
RBC: 5.2 x10E6/uL (ref 4.14–5.80)
RDW: 13.3 % (ref 11.6–15.4)
WBC: 6.8 10*3/uL (ref 3.4–10.8)

## 2020-11-10 ENCOUNTER — Ambulatory Visit: Payer: Medicare HMO | Admitting: Family Medicine

## 2020-12-31 ENCOUNTER — Other Ambulatory Visit: Payer: Self-pay | Admitting: Family Medicine

## 2020-12-31 DIAGNOSIS — K219 Gastro-esophageal reflux disease without esophagitis: Secondary | ICD-10-CM

## 2021-03-16 ENCOUNTER — Other Ambulatory Visit: Payer: Self-pay | Admitting: Family Medicine

## 2021-03-16 DIAGNOSIS — E785 Hyperlipidemia, unspecified: Secondary | ICD-10-CM

## 2021-04-06 ENCOUNTER — Encounter: Payer: Medicare HMO | Admitting: Family Medicine

## 2021-05-16 ENCOUNTER — Other Ambulatory Visit: Payer: Self-pay | Admitting: Family Medicine

## 2021-05-16 DIAGNOSIS — I10 Essential (primary) hypertension: Secondary | ICD-10-CM

## 2021-05-29 ENCOUNTER — Other Ambulatory Visit: Payer: Self-pay | Admitting: Family Medicine

## 2021-05-29 DIAGNOSIS — K219 Gastro-esophageal reflux disease without esophagitis: Secondary | ICD-10-CM

## 2021-07-26 DIAGNOSIS — H2513 Age-related nuclear cataract, bilateral: Secondary | ICD-10-CM | POA: Diagnosis not present

## 2021-08-10 ENCOUNTER — Other Ambulatory Visit: Payer: Self-pay | Admitting: Family Medicine

## 2021-08-10 DIAGNOSIS — I2510 Atherosclerotic heart disease of native coronary artery without angina pectoris: Secondary | ICD-10-CM

## 2021-08-10 DIAGNOSIS — I10 Essential (primary) hypertension: Secondary | ICD-10-CM

## 2021-08-12 DIAGNOSIS — H524 Presbyopia: Secondary | ICD-10-CM | POA: Diagnosis not present

## 2021-09-27 ENCOUNTER — Other Ambulatory Visit: Payer: Self-pay | Admitting: Family Medicine

## 2021-09-27 DIAGNOSIS — E785 Hyperlipidemia, unspecified: Secondary | ICD-10-CM

## 2021-09-28 ENCOUNTER — Other Ambulatory Visit: Payer: Self-pay

## 2021-10-12 ENCOUNTER — Ambulatory Visit: Payer: Medicare HMO | Admitting: Family Medicine

## 2021-10-23 ENCOUNTER — Other Ambulatory Visit: Payer: Self-pay | Admitting: Family Medicine

## 2021-10-23 DIAGNOSIS — K219 Gastro-esophageal reflux disease without esophagitis: Secondary | ICD-10-CM

## 2022-01-15 ENCOUNTER — Telehealth: Payer: Self-pay

## 2022-01-15 NOTE — Telephone Encounter (Signed)
This nurse called patient for scheduled telephonic AWV. Patient stated that he was in [place], [place]. We will need to reschedule for another time.

## 2022-02-26 ENCOUNTER — Ambulatory Visit (INDEPENDENT_AMBULATORY_CARE_PROVIDER_SITE_OTHER): Payer: Medicare HMO

## 2022-02-26 VITALS — Ht 72.0 in | Wt 245.0 lb

## 2022-02-26 DIAGNOSIS — Z0000 Encounter for general adult medical examination without abnormal findings: Secondary | ICD-10-CM | POA: Diagnosis not present

## 2022-02-26 NOTE — Patient Instructions (Signed)
Martin Dickerson , Thank you for taking time to come for your Medicare Wellness Visit. I appreciate your ongoing commitment to your health goals. Please review the following plan we discussed and let me know if I can assist you in the future.   Screening recommendations/referrals: Colonoscopy: not required Recommended yearly ophthalmology/optometry visit for glaucoma screening and checkup Recommended yearly dental visit for hygiene and checkup  Vaccinations: Influenza vaccine: due Pneumococcal vaccine: completed 07/19/2013 Tdap vaccine: completed 07/17/2012, due 07/17/2022 Shingles vaccine: completed    Covid-19:  09/26/2020, 04/08/2020, 08/27/2019, 08/02/2019  Advanced directives: Please bring a copy of your POA (Power of Attorney) and/or Living Will to your next appointment.   Conditions/risks identified: none  Next appointment: Follow up in one year for your annual wellness visit.   Preventive Care 57 Years and Older, Male Preventive care refers to lifestyle choices and visits with your health care provider that can promote health and wellness. What does preventive care include? A yearly physical exam. This is also called an annual well check. Dental exams once or twice a year. Routine eye exams. Ask your health care provider how often you should have your eyes checked. Personal lifestyle choices, including: Daily care of your teeth and gums. Regular physical activity. Eating a healthy diet. Avoiding tobacco and drug use. Limiting alcohol use. Practicing safe sex. Taking low doses of aspirin every day. Taking vitamin and mineral supplements as recommended by your health care provider. What happens during an annual well check? The services and screenings done by your health care provider during your annual well check will depend on your age, overall health, lifestyle risk factors, and family history of disease. Counseling  Your health care provider may ask you questions about your: Alcohol  use. Tobacco use. Drug use. Emotional well-being. Home and relationship well-being. Sexual activity. Eating habits. History of falls. Memory and ability to understand (cognition). Work and work Statistician. Screening  You may have the following tests or measurements: Height, weight, and BMI. Blood pressure. Lipid and cholesterol levels. These may be checked every 5 years, or more frequently if you are over 50 years old. Skin check. Lung cancer screening. You may have this screening every year starting at age 78 if you have a 30-pack-year history of smoking and currently smoke or have quit within the past 15 years. Fecal occult blood test (FOBT) of the stool. You may have this test every year starting at age 78. Flexible sigmoidoscopy or colonoscopy. You may have a sigmoidoscopy every 5 years or a colonoscopy every 10 years starting at age 78. Prostate cancer screening. Recommendations will vary depending on your family history and other risks. Hepatitis C blood test. Hepatitis B blood test. Sexually transmitted disease (STD) testing. Diabetes screening. This is done by checking your blood sugar (glucose) after you have not eaten for a while (fasting). You may have this done every 1-3 years. Abdominal aortic aneurysm (AAA) screening. You may need this if you are a current or former smoker. Osteoporosis. You may be screened starting at age 78 if you are at high risk. Talk with your health care provider about your test results, treatment options, and if necessary, the need for more tests. Vaccines  Your health care provider may recommend certain vaccines, such as: Influenza vaccine. This is recommended every year. Tetanus, diphtheria, and acellular pertussis (Tdap, Td) vaccine. You may need a Td booster every 10 years. Zoster vaccine. You may need this after age 45. Pneumococcal 13-valent conjugate (PCV13) vaccine. One dose is  recommended after age 27. Pneumococcal polysaccharide  (PPSV23) vaccine. One dose is recommended after age 24. Talk to your health care provider about which screenings and vaccines you need and how often you need them. This information is not intended to replace advice given to you by your health care provider. Make sure you discuss any questions you have with your health care provider. Document Released: 07/11/2015 Document Revised: 03/03/2016 Document Reviewed: 04/15/2015 Elsevier Interactive Patient Education  2017 [employer] Prevention in the Home Falls can cause injuries. They can happen to people of all ages. There are many things you can do to make your home safe and to help prevent falls. What can I do on the outside of my home? Regularly fix the edges of walkways and driveways and fix any cracks. Remove anything that might make you trip as you walk through a door, such as a raised step or threshold. Trim any bushes or trees on the path to your home. Use bright outdoor lighting. Clear any walking paths of anything that might make someone trip, such as rocks or tools. Regularly check to see if handrails are loose or broken. Make sure that both sides of any steps have handrails. Any raised decks and porches should have guardrails on the edges. Have any leaves, snow, or ice cleared regularly. Use sand or salt on walking paths during winter. Clean up any spills in your garage right away. This includes oil or grease spills. What can I do in the bathroom? Use night lights. Install grab bars by the toilet and in the tub and shower. Do not use towel bars as grab bars. Use non-skid mats or decals in the tub or shower. If you need to sit down in the shower, use a plastic, non-slip stool. Keep the floor dry. Clean up any water that spills on the floor as soon as it happens. Remove soap buildup in the tub or shower regularly. Attach bath mats securely with double-sided non-slip rug tape. Do not have throw rugs and other things on the  floor that can make you trip. What can I do in the bedroom? Use night lights. Make sure that you have a light by your bed that is easy to reach. Do not use any sheets or blankets that are too big for your bed. They should not hang down onto the floor. Have a firm chair that has side arms. You can use this for support while you get dressed. Do not have throw rugs and other things on the floor that can make you trip. What can I do in the kitchen? Clean up any spills right away. Avoid walking on wet floors. Keep items that you use a lot in easy-to-reach places. If you need to reach something above you, use a strong step stool that has a grab bar. Keep electrical cords out of the way. Do not use floor polish or wax that makes floors slippery. If you must use wax, use non-skid floor wax. Do not have throw rugs and other things on the floor that can make you trip. What can I do with my stairs? Do not leave any items on the stairs. Make sure that there are handrails on both sides of the stairs and use them. Fix handrails that are broken or loose. Make sure that handrails are as long as the stairways. Check any carpeting to make sure that it is firmly attached to the stairs. Fix any carpet that is loose or worn. Avoid having  throw rugs at the top or bottom of the stairs. If you do have throw rugs, attach them to the floor with carpet tape. Make sure that you have a light switch at the top of the stairs and the bottom of the stairs. If you do not have them, ask someone to add them for you. What else can I do to help prevent falls? Wear shoes that: Do not have high heels. Have rubber bottoms. Are comfortable and fit you well. Are closed at the toe. Do not wear sandals. If you use a stepladder: Make sure that it is fully opened. Do not climb a closed stepladder. Make sure that both sides of the stepladder are locked into place. Ask someone to hold it for you, if possible. Clearly mark and make  sure that you can see: Any grab bars or handrails. First and last steps. Where the edge of each step is. Use tools that help you move around (mobility aids) if they are needed. These include: Canes. Walkers. Scooters. Crutches. Turn on the lights when you go into a dark area. Replace any light bulbs as soon as they burn out. Set up your furniture so you have a clear path. Avoid moving your furniture around. If any of your floors are uneven, fix them. If there are any pets around you, be aware of where they are. Review your medicines with your doctor. Some medicines can make you feel dizzy. This can increase your chance of falling. Ask your doctor what other things that you can do to help prevent falls. This information is not intended to replace advice given to you by your health care provider. Make sure you discuss any questions you have with your health care provider. Document Released: 04/10/2009 Document Revised: 11/20/2015 Document Reviewed: 07/19/2014 Elsevier Interactive Patient Education  2017 [employer].

## 2022-02-26 NOTE — Progress Notes (Signed)
I connected with Abby Potash today by telephone and verified that I am speaking with the correct person using two identifiers. Location patient: home Location provider: work Persons participating in the virtual visit: Raghav Verrilli, Glenna Durand LPN.   I discussed the limitations, risks, security and privacy concerns of performing an evaluation and management service by telephone and the availability of in person appointments. I also discussed with the patient that there may be a patient responsible charge related to this service. The patient expressed understanding and verbally consented to this telephonic visit.    Interactive audio and video telecommunications were attempted between this provider and patient, however failed, due to patient having technical difficulties OR patient did not have access to video capability.  We continued and completed visit with audio only.     Vital signs may be patient reported or missing.  Subjective:   Martin Dickerson is a 76 y.o. male who presents for Medicare Annual/Subsequent preventive examination.  Review of Systems     Cardiac Risk Factors include: advanced age (>16mn, >>42women);dyslipidemia;hypertension;obesity (BMI >30kg/m2);male gender     Objective:    Today's Vitals   02/26/22 1156  Weight: 245 lb (111.1 kg)  Height: 6' (1.829 m)   Body mass index is 33.23 kg/m.     02/26/2022   12:01 PM 10/01/2020   11:14 AM 09/04/2019    9:25 AM 11/28/2018    2:03 PM 11/11/2017    9:29 AM 09/02/2016   11:07 AM 05/27/2015   12:14 PM  Advanced Directives  Does Patient Have a Medical Advance Directive? Yes No No No  No No  Type of AParamedicof AGrantsLiving will        Copy of [place] Chart? No - copy requested        Would patient like information on creating a medical advance directive?  Yes (ED - Information included in AVS) No - Patient declined No - Patient declined Yes (MAU/Ambulatory/Procedural  Areas - Information given) Yes (MAU/Ambulatory/Procedural Areas - Information given) No - patient declined information    Current Medications (verified) Outpatient Encounter Medications as of 02/26/2022  Medication Sig   aspirin EC 81 MG tablet Take 81 mg by mouth daily.   atorvastatin (LIPITOR) 20 MG tablet TAKE 1 TABLET (20 MG TOTAL) BY MOUTH AT BEDTIME.   hydrochlorothiazide (HYDRODIURIL) 25 MG tablet TAKE 1 TABLET EVERY DAY (NEED MD APPOINTMENT)   metoprolol tartrate (LOPRESSOR) 25 MG tablet TAKE 1 TABLET TWICE DAILY   nitroGLYCERIN (NITROSTAT) 0.4 MG SL tablet DISSOLVE ONE TABLET UNDER THE TONGUE EVERY 5 MINUTES AS NEEDED FOR CHEST PAIN.   pantoprazole (PROTONIX) 40 MG tablet TAKE 1 TABLET EVERY DAY   potassium chloride (KLOR-CON M) 10 MEQ tablet TAKE 1 TABLET EVERY DAY   ranitidine (ZANTAC) 150 MG tablet TAKE 1 TABLET AT BEDTIME (Patient taking differently: As needed)   cyclobenzaprine (FLEXERIL) 10 MG tablet Take 0.5 tablets (5 mg total) by mouth at bedtime. (Patient not taking: Reported on 10/01/2020)   HYDROcodone-acetaminophen (NORCO) 5-325 MG tablet Take 1 tablet by mouth every 6 (six) hours as needed for moderate pain. One to two tabs every 4-6 hours for pain (Patient not taking: Reported on 10/01/2020)   methylPREDNISolone (MEDROL) 4 MG tablet Take as directed (Patient not taking: Reported on 10/01/2020)   No facility-administered encounter medications on file as of 02/26/2022.    Allergies (verified) Patient has no known allergies.   History: Past Medical History:  Diagnosis Date   AAA (abdominal aortic aneurysm) ([place]) 2007   ASHD (arteriosclerotic heart disease)    ED (erectile dysfunction)    Emphysema     a "Touch" per pt   GERD (gastroesophageal reflux disease)    takes Zantac nightly   Hemorrhoids    History of colon polyps    Hyperlipidemia    takes Lipitor daily   Hypertension    takes Metoprolol and HCTZ daily   Myocardial infarction [facility]) 2002   has never had to  take Nitroglycerin   Obesity    PONV (postoperative nausea and vomiting)    SVT (supraventricular tachycardia) ([place])    Past Surgical History:  Procedure Laterality Date   ABDOMINAL AORTIC ENDOVASCULAR STENT GRAFT N/A 08/28/2012   Procedure: ABDOMINAL AORTIC ENDOVASCULAR STENT GRAFT;  Surgeon: Rosetta Posner, MD;  Location: [facility] OR;  Service: Vascular;  Laterality: N/A;  Ultrasound guided   CARDIAC CATHETERIZATION  03/22/2001   EF 40%   CARDIOVASCULAR STRESS TEST  01/31/2007   EF 68% NO EVIDENCE OF ISCHEMIA   COLONOSCOPY     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   quadruple   ERCP     IR US GUIDE VASC ACCESS LEFT  08/15/2019   US ECHOCARDIOGRAPHY  10/26/2005   EF 55-60%   Family History  Problem Relation Age of Onset   Stroke Mother    Hypertension Father    Heart disease Maternal Uncle    Social History   Socioeconomic History   Marital status: Married    Spouse name: Not on file   Number of children: Not on file   Years of education: Not on file   Highest education level: Not on file  Occupational History   Not on file  Tobacco Use   Smoking status: Former    Years: 35.00    Types: Cigarettes    Quit date: 06/28/2000    Years since quitting: 21.6   Smokeless tobacco: Never   Tobacco comments:    quit in 2002  Vaping Use   Vaping Use: Never used  Substance and Sexual Activity   Alcohol use: No   Drug use: No   Sexual activity: Yes  Other Topics Concern   Not on file  Social History Narrative   Not on file   Social Determinants of Health   Financial Resource Strain: Low Risk  (02/26/2022)   Overall Financial Resource Strain (CARDIA)    Difficulty of Paying Living Expenses: Not hard at all  Food Insecurity: No Food Insecurity (02/26/2022)   Hunger Vital Sign    Worried About Running Out of Food in the Last Year: Never true    [place] in the Last Year: Never true  Transportation Needs: No Transportation Needs (02/26/2022)   PRAPARE - Radiographer, therapeutic (Medical): No    Lack of Transportation (Non-Medical): No  Physical Activity: Insufficiently Active (02/26/2022)   Exercise Vital Sign    Days of Exercise per Week: 2 days    Minutes of Exercise per Session: 30 min  Stress: No Stress Concern Present (02/26/2022)   [place]    Feeling of Stress : Only a little  Social Connections: Not on file    Tobacco Counseling Counseling given: Not Answered Tobacco comments: quit in 2002   Clinical Intake:  Pre-visit preparation completed: Yes  Pain : No/denies pain     Nutritional Status: BMI > 30  Obese Nutritional Risks: None Diabetes: No  How often do you need to have someone help you when you read instructions, pamphlets, or other written materials from your doctor or pharmacy?: (P) 3 - Sometimes What is the last grade level you completed in school?: 9th grade  Diabetic? no  Interpreter Needed?: No  Information entered by :: NAllen LPN   Activities of Daily Living    02/26/2022   12:03 PM 02/22/2022   10:11 AM  In your present state of health, do you have any difficulty performing the following activities:  Hearing? 0 0  Vision? 0 0  Difficulty concentrating or making decisions? 0 0  Walking or climbing stairs? 0 0  Dressing or bathing? 0 0  Doing errands, shopping? 0 0  Preparing Food and eating ? N N  Using the Toilet? N N  In the past six months, have you accidently leaked urine? N N  Do you have problems with loss of bowel control? N N  Managing your Medications? N N  Managing your Finances? N N  Housekeeping or managing your Housekeeping? N N    Patient Care Team: Denita Lung, MD as PCP - General (Family Medicine) Early, Arvilla Meres, MD as Attending Physician (Vascular Surgery)  Indicate any recent Medical Services you may have received from other than [facility] providers in the past year (date may be approximate).     Assessment:    This is a routine wellness examination for Martin Dickerson.  Hearing/Vision screen Vision Screening - Comments:: Regular eye exams, [facility]  Dietary issues and exercise activities discussed: Current Exercise Habits: Home exercise routine, Time (Minutes): 30, Frequency (Times/Week): 2, Weekly Exercise (Minutes/Week): 60   Goals Addressed             This Visit's Progress    Patient Stated       02/26/2022, no goals       Depression Screen    02/26/2022   12:02 PM 10/01/2020   11:15 AM 11/28/2018    1:42 PM 11/11/2017    9:08 AM 09/02/2016   10:07 AM 08/11/2015   10:03 AM 07/22/2014    8:39 AM  PHQ 2/9 Scores  PHQ - 2 Score 0 0 0 0 0 0 0  PHQ- 9 Score 0          Fall Risk    02/26/2022   12:02 PM 02/22/2022   10:11 AM 10/01/2020   11:14 AM 11/28/2018    1:42 PM 11/11/2017    9:08 AM  Fall Risk   Falls in the past year? 0 0 0 0 No  Number falls in past yr: 0 0 0    Injury with Fall? 0 0 0    Risk for fall due to : Medication side effect  No Fall Risks    Follow up Falls prevention discussed;Education provided;Falls evaluation completed  Falls evaluation completed      FALL RISK PREVENTION PERTAINING TO THE HOME:  Any stairs in or around the home? Yes  If so, are there any without handrails? No  Home free of loose throw rugs in walkways, pet beds, electrical cords, etc? Yes  Adequate lighting in your home to reduce risk of falls? Yes   ASSISTIVE DEVICES UTILIZED TO PREVENT FALLS:  Life alert? No  Use of a cane, walker or w/c? No  Grab bars in the bathroom? Yes  Shower chair or bench in shower? No  Elevated toilet seat or a handicapped toilet? Yes  TIMED UP AND GO:  Was the test performed? No .      Cognitive Function:        02/26/2022   12:04 PM  6CIT Screen  What Year? 0 points  What month? 0 points  What time? 0 points  Count back from 20 0 points  Months in reverse 4 points  Repeat phrase 2 points  Total Score 6 points    Immunizations Immunization  History  Administered Date(s) Administered   DTaP 04/06/2002   Fluad Quad(high Dose 65+) 03/21/2019   Influenza Split 03/22/2011, 03/13/2012   Influenza Whole 04/06/2002, 04/26/2007   Influenza-Unspecified 03/28/2013, 03/28/2014, 03/21/2019, 03/30/2021   PFIZER(Purple Top)SARS-COV-2 Vaccination 08/02/2019, 08/27/2019, 04/08/2020, 09/26/2020   Pneumococcal Conjugate-13 07/19/2013   Pneumococcal Polysaccharide-23 06/05/2007   Tdap 07/17/2012   Zoster Recombinat (Shingrix) 10/03/2020, 12/17/2020   Zoster, Live 06/05/2007    TDAP status: Up to date  Flu Vaccine status: Due, Education has been provided regarding the importance of this vaccine. Advised may receive this vaccine at local pharmacy or Health Dept. Aware to provide a copy of the vaccination record if obtained from local pharmacy or Health Dept. Verbalized acceptance and understanding.  Pneumococcal vaccine status: Up to date  Covid-19 vaccine status: Completed vaccines  Qualifies for Shingles Vaccine? Yes   Zostavax completed Yes   Shingrix Completed?: Yes  Screening Tests Health Maintenance  Topic Date Due   COVID-19 Vaccine (5 - Pfizer series) 11/21/2020   INFLUENZA VACCINE  01/26/2022   Pneumonia Vaccine 38+ Years old (3 - PPSV23 or PCV20) 10/09/2025 (Originally 07/19/2014)   TETANUS/TDAP  07/17/2022   Hepatitis C Screening  Completed   Zoster Vaccines- Shingrix  Completed   HPV VACCINES  Aged Out   Fecal DNA (Cologuard)  Discontinued    Health Maintenance  Health Maintenance Due  Topic Date Due   COVID-19 Vaccine (5 - Pfizer series) 11/21/2020   INFLUENZA VACCINE  01/26/2022    Colorectal cancer screening: No longer required.   Lung Cancer Screening: (Low Dose CT Chest recommended if Age 73-80 years, 30 pack-year currently smoking OR have quit w/in 15years.) does not qualify.   Lung Cancer Screening Referral: no  Additional Screening:  Hepatitis C Screening: does qualify; Completed 08/11/2015  Vision  Screening: Recommended annual ophthalmology exams for early detection of glaucoma and other disorders of the eye. Is the patient up to date with their annual eye exam?  Yes  Who is the provider or what is the name of the office in which the patient attends annual eye exams? [facility] If pt is not established with a provider, would they like to be referred to a provider to establish care? No .   Dental Screening: Recommended annual dental exams for proper oral hygiene  Community Resource Referral / Chronic Care Management: CRR required this visit?  No   CCM required this visit?  No      Plan:     I have personally reviewed and noted the following in the patient's chart:   Medical and social history Use of alcohol, tobacco or illicit drugs  Current medications and supplements including opioid prescriptions. Patient is not currently taking opioid prescriptions. Functional ability and status Nutritional status Physical activity Advanced directives List of other physicians Hospitalizations, surgeries, and ER visits in previous 12 months Vitals Screenings to include cognitive, depression, and falls Referrals and appointments  In addition, I have reviewed and discussed with patient certain preventive protocols, quality metrics, and best practice recommendations. A  written personalized care plan for preventive services as well as general preventive health recommendations were provided to patient.     Kellie Simmering, LPN   07/04/7114   Nurse Notes: none  Due to this being a virtual visit, the after visit summary with patients personalized plan was offered to patient via mail or my-chart. Patient would like to access on my-chart

## 2022-02-28 ENCOUNTER — Other Ambulatory Visit: Payer: Self-pay | Admitting: Family Medicine

## 2022-02-28 DIAGNOSIS — E785 Hyperlipidemia, unspecified: Secondary | ICD-10-CM

## 2022-02-28 DIAGNOSIS — I10 Essential (primary) hypertension: Secondary | ICD-10-CM

## 2022-03-03 ENCOUNTER — Encounter: Payer: Self-pay | Admitting: Internal Medicine

## 2022-03-12 ENCOUNTER — Encounter: Payer: Self-pay | Admitting: Family Medicine

## 2022-03-12 ENCOUNTER — Ambulatory Visit (INDEPENDENT_AMBULATORY_CARE_PROVIDER_SITE_OTHER): Payer: Medicare HMO | Admitting: Family Medicine

## 2022-03-12 VITALS — BP 130/72 | HR 63 | Temp 97.2°F | Ht 71.0 in | Wt 254.2 lb

## 2022-03-12 DIAGNOSIS — I10 Essential (primary) hypertension: Secondary | ICD-10-CM

## 2022-03-12 DIAGNOSIS — Z8679 Personal history of other diseases of the circulatory system: Secondary | ICD-10-CM

## 2022-03-12 DIAGNOSIS — Z0000 Encounter for general adult medical examination without abnormal findings: Secondary | ICD-10-CM

## 2022-03-12 DIAGNOSIS — E669 Obesity, unspecified: Secondary | ICD-10-CM

## 2022-03-12 DIAGNOSIS — N5201 Erectile dysfunction due to arterial insufficiency: Secondary | ICD-10-CM | POA: Diagnosis not present

## 2022-03-12 DIAGNOSIS — I2510 Atherosclerotic heart disease of native coronary artery without angina pectoris: Secondary | ICD-10-CM

## 2022-03-12 DIAGNOSIS — K219 Gastro-esophageal reflux disease without esophagitis: Secondary | ICD-10-CM | POA: Diagnosis not present

## 2022-03-12 DIAGNOSIS — E785 Hyperlipidemia, unspecified: Secondary | ICD-10-CM | POA: Diagnosis not present

## 2022-03-12 DIAGNOSIS — Z87891 Personal history of nicotine dependence: Secondary | ICD-10-CM | POA: Diagnosis not present

## 2022-03-12 DIAGNOSIS — Z9889 Other specified postprocedural states: Secondary | ICD-10-CM

## 2022-03-12 LAB — LIPID PANEL

## 2022-03-12 MED ORDER — PANTOPRAZOLE SODIUM 40 MG PO TBEC: 40.0000 mg | DELAYED_RELEASE_TABLET | Freq: Every day | ORAL | 3 refills | Status: DC

## 2022-03-12 MED ORDER — METOPROLOL TARTRATE 25 MG PO TABS: 25.0000 mg | ORAL_TABLET | Freq: Two times a day (BID) | ORAL | 3 refills | Status: DC

## 2022-03-12 MED ORDER — HYDROCHLOROTHIAZIDE 25 MG PO TABS: ORAL_TABLET | ORAL | 1 refills | Status: DC

## 2022-03-12 MED ORDER — ATORVASTATIN CALCIUM 20 MG PO TABS: 20.0000 mg | ORAL_TABLET | Freq: Every day | ORAL | 3 refills | Status: DC

## 2022-03-12 NOTE — Patient Instructions (Signed)
Health Maintenance, Male Adopting a healthy lifestyle and getting preventive care are important in promoting health and wellness. Ask your health care provider about: The right schedule for you to have regular tests and exams. Things you can do on your own to prevent diseases and keep yourself healthy. What should I know about diet, weight, and exercise? Eat a healthy diet  Eat a diet that includes plenty of vegetables, fruits, low-fat dairy products, and lean protein. Do not eat a lot of foods that are high in solid fats, added sugars, or sodium. Maintain a healthy weight Body mass index (BMI) is a measurement that can be used to identify possible weight problems. It estimates body fat based on height and weight. Your health care provider can help determine your BMI and help you achieve or maintain a healthy weight. Get regular exercise Get regular exercise. This is one of the most important things you can do for your health. Most adults should: Exercise for at least 150 minutes each week. The exercise should increase your heart rate and make you sweat (moderate-intensity exercise). Do strengthening exercises at least twice a week. This is in addition to the moderate-intensity exercise. Spend less time sitting. Even light physical activity can be beneficial. Watch cholesterol and blood lipids Have your blood tested for lipids and cholesterol at 78 years of age, then have this test every 5 years. You may need to have your cholesterol levels checked more often if: Your lipid or cholesterol levels are high. You are older than 78 years of age. You are at high risk for heart disease. What should I know about cancer screening? Many types of cancers can be detected early and may often be prevented. Depending on your health history and family history, you may need to have cancer screening at various ages. This may include screening for: Colorectal cancer. Prostate cancer. Skin cancer. Lung  cancer. What should I know about heart disease, diabetes, and high blood pressure? Blood pressure and heart disease High blood pressure causes heart disease and increases the risk of stroke. This is more likely to develop in people who have high blood pressure readings or are overweight. Talk with your health care provider about your target blood pressure readings. Have your blood pressure checked: Every 3-5 years if you are 18-39 years of age. Every year if you are 40 years old or older. If you are between the ages of 65 and 75 and are a current or former smoker, ask your health care provider if you should have a one-time screening for abdominal aortic aneurysm (AAA). Diabetes Have regular diabetes screenings. This checks your fasting blood sugar level. Have the screening done: Once every three years after age 45 if you are at a normal weight and have a low risk for diabetes. More often and at a younger age if you are overweight or have a high risk for diabetes. What should I know about preventing infection? Hepatitis B If you have a higher risk for hepatitis B, you should be screened for this virus. Talk with your health care provider to find out if you are at risk for hepatitis B infection. Hepatitis C Blood testing is recommended for: Everyone born from 1945 through 1965. Anyone with known risk factors for hepatitis C. Sexually transmitted infections (STIs) You should be screened each year for STIs, including gonorrhea and chlamydia, if: You are sexually active and are younger than 78 years of age. You are older than 78 years of age and your   health care provider tells you that you are at risk for this type of infection. Your sexual activity has changed since you were last screened, and you are at increased risk for chlamydia or gonorrhea. Ask your health care provider if you are at risk. Ask your health care provider about whether you are at high risk for HIV. Your health care provider  may recommend a prescription medicine to help prevent HIV infection. If you choose to take medicine to prevent HIV, you should first get tested for HIV. You should then be tested every 3 months for as long as you are taking the medicine. Follow these instructions at home: Alcohol use Do not drink alcohol if your health care provider tells you not to drink. If you drink alcohol: Limit how much you have to 0-2 drinks a day. Know how much alcohol is in your drink. In the [place] one drink equals one 12 oz bottle of beer (355 mL), one 5 oz glass of wine (148 mL), or one 1 oz glass of hard liquor (44 mL). Lifestyle Do not use any products that contain nicotine or tobacco. These products include cigarettes, chewing tobacco, and vaping devices, such as e-cigarettes. If you need help quitting, ask your health care provider. Do not use street drugs. Do not share needles. Ask your health care provider for help if you need support or information about quitting drugs. General instructions Schedule regular health, dental, and eye exams. Stay current with your vaccines. Tell your health care provider if: You often feel depressed. You have ever been abused or do not feel safe at home. Summary Adopting a healthy lifestyle and getting preventive care are important in promoting health and wellness. Follow your health care provider's instructions about healthy diet, exercising, and getting tested or screened for diseases. Follow your health care provider's instructions on monitoring your cholesterol and blood pressure. This information is not intended to replace advice given to you by your health care provider. Make sure you discuss any questions you have with your health care provider. Document Revised: 11/03/2020 Document Reviewed: 11/03/2020 Elsevier Patient Education  2023 Elsevier Inc.  

## 2022-03-12 NOTE — Progress Notes (Signed)
Complete physical exam  Patient: Martin Dickerson   DOB: 1945-08-22   76 y.o. Male  MRN: [id number]  Subjective:    Chief Complaint  Patient presents with   Annual Exam    Fasting     Martin Dickerson is a 76 y.o. male who presents today for a complete physical exam. He reports consuming a general diet.  Staying active 20 minutes a day.  He generally feels well. He reports sleeping well. He does not have additional problems to discuss today.  He has underlying ASHD.  He has had no chest pain, shortness of breath, PND or DOE.  He follows up with vascular surgeons concerning repair of his AAA.  He is scheduled to be seen by them in the near future.  Continues on atorvastatin without difficulty.  He is also taking metoprolol and HCTZ.  He has not had to use his nitroglycerin.  His reflux is under good control with Protonix.  EGD is not a particular problem at the present time.  He did quit smoking several years ago.   Most recent fall risk assessment:    02/26/2022   12:02 PM  [place] in the past year? 0  Number falls in past yr: 0  Injury with Fall? 0  Risk for fall due to : Medication side effect  Follow up Falls prevention discussed;Education provided;Falls evaluation completed     Most recent depression screenings:    02/26/2022   12:02 PM 10/01/2020   11:15 AM  PHQ 2/9 Scores  PHQ - 2 Score 0 0  PHQ- 9 Score 0       Patient Active Problem List   Diagnosis Date Noted   Erectile dysfunction due to arterial insufficiency 08/11/2015   Former smoker 08/11/2015   S/P AAA repair 10/03/2012   ASHD (arteriosclerotic heart disease) 03/22/2011   Hypertension 03/22/2011   Hyperlipidemia LDL goal <70 03/22/2011   GERD (gastroesophageal reflux disease) 03/22/2011   Past Medical History:  Diagnosis Date   AAA (abdominal aortic aneurysm) ([place]) 2007   ASHD (arteriosclerotic heart disease)    ED (erectile dysfunction)    Emphysema     a "Touch" per pt   GERD (gastroesophageal  reflux disease)    takes Zantac nightly   Hemorrhoids    History of colon polyps    Hyperlipidemia    takes Lipitor daily   Hypertension    takes Metoprolol and HCTZ daily   Myocardial infarction ([place]) 2002   has never had to take Nitroglycerin   Obesity    PONV (postoperative nausea and vomiting)    SVT (supraventricular tachycardia) ([place])    Past Surgical History:  Procedure Laterality Date   ABDOMINAL AORTIC ENDOVASCULAR STENT GRAFT N/A 08/28/2012   Procedure: ABDOMINAL AORTIC ENDOVASCULAR STENT GRAFT;  Surgeon: Rosetta Posner, MD;  Location: [facility];  Service: Vascular;  Laterality: N/A;  Ultrasound guided   CARDIAC CATHETERIZATION  03/22/2001   EF 40%   CARDIOVASCULAR STRESS TEST  01/31/2007   EF 68% NO EVIDENCE OF ISCHEMIA   COLONOSCOPY     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   quadruple   ERCP     IR US GUIDE VASC ACCESS LEFT  08/15/2019   US ECHOCARDIOGRAPHY  10/26/2005   EF 55-60%   Social History   Tobacco Use   Smoking status: Former    Years: 35.00    Types: Cigarettes    Quit date: 06/28/2000    Years  since quitting: 21.7   Smokeless tobacco: Never   Tobacco comments:    quit in 2002  Vaping Use   Vaping Use: Never used  Substance Use Topics   Alcohol use: No   Drug use: No   Family History  Problem Relation Age of Onset   Stroke Mother    Hypertension Father    Heart disease Maternal Uncle    No Known Allergies    Patient Care Team: Denita Lung, MD as PCP - General (Family Medicine) Early, Arvilla Meres, MD as Attending Physician (Vascular Surgery)   Outpatient Medications Prior to Visit  Medication Sig   aspirin EC 81 MG tablet Take 81 mg by mouth daily.   atorvastatin (LIPITOR) 20 MG tablet TAKE 1 TABLET (20 MG TOTAL) BY MOUTH AT BEDTIME.   hydrochlorothiazide (HYDRODIURIL) 25 MG tablet TAKE 1 TABLET EVERY DAY (NEED MD APPOINTMENT)   metoprolol tartrate (LOPRESSOR) 25 MG tablet TAKE 1 TABLET TWICE DAILY   pantoprazole (PROTONIX) 40 MG tablet TAKE 1  TABLET EVERY DAY   potassium chloride (KLOR-CON M) 10 MEQ tablet TAKE 1 TABLET EVERY DAY   cyclobenzaprine (FLEXERIL) 10 MG tablet Take 0.5 tablets (5 mg total) by mouth at bedtime. (Patient not taking: Reported on 10/01/2020)   HYDROcodone-acetaminophen (NORCO) 5-325 MG tablet Take 1 tablet by mouth every 6 (six) hours as needed for moderate pain. One to two tabs every 4-6 hours for pain (Patient not taking: Reported on 10/01/2020)   methylPREDNISolone (MEDROL) 4 MG tablet Take as directed (Patient not taking: Reported on 10/01/2020)   nitroGLYCERIN (NITROSTAT) 0.4 MG SL tablet DISSOLVE ONE TABLET UNDER THE TONGUE EVERY 5 MINUTES AS NEEDED FOR CHEST PAIN. (Patient not taking: Reported on 03/12/2022)   ranitidine (ZANTAC) 150 MG tablet TAKE 1 TABLET AT BEDTIME (Patient not taking: Reported on 03/12/2022)   No facility-administered medications prior to visit.    Review of Systems  All other systems reviewed and are negative.         Objective:     BP 130/72   Pulse 63   Temp (!) 97.2 F (36.2 C)   Ht 5' 11"  (1.803 m)   Wt 254 lb 3.2 oz (115.3 kg)   SpO2 97%   BMI 35.45 kg/m  BP Readings from Last 3 Encounters:  03/12/22 130/72  10/01/20 136/76  09/04/19 129/80   Wt Readings from Last 3 Encounters:  03/12/22 254 lb 3.2 oz (115.3 kg)  02/26/22 245 lb (111.1 kg)  10/01/20 244 lb 3.2 oz (110.8 kg)      Physical Exam  Alert and in no distress. Tympanic membranes and canals are normal. Pharyngeal area is normal. Neck is supple without adenopathy or thyromegaly. Cardiac exam shows a regular sinus rhythm without murmurs or gallops. Lungs are clear to auscultation.  Abdominal exam shows no masses or tenderness.  Last CBC Lab Results  Component Value Date   WBC 6.8 10/01/2020   HGB 16.3 10/01/2020   HCT 49.7 10/01/2020   MCV 96 10/01/2020   MCH 31.3 10/01/2020   RDW 13.3 10/01/2020   PLT 152 21/97/5883   Last metabolic panel Lab Results  Component Value Date   GLUCOSE 98  10/01/2020   NA 141 10/01/2020   K 5.4 (H) 10/01/2020   CL 100 10/01/2020   CO2 22 10/01/2020   BUN 12 10/01/2020   CREATININE 1.15 10/01/2020   EGFR 66 10/01/2020   CALCIUM 10.0 10/01/2020   PROT 7.5 10/01/2020   ALBUMIN 4.6 10/01/2020  LABGLOB 2.9 10/01/2020   AGRATIO 1.6 10/01/2020   BILITOT 1.0 10/01/2020   ALKPHOS 110 10/01/2020   AST 25 10/01/2020   ALT 27 10/01/2020   Last lipids Lab Results  Component Value Date   CHOL 152 10/01/2020   HDL 44 10/01/2020   LDLCALC 83 10/01/2020   TRIG 143 10/01/2020   CHOLHDL 3.5 10/01/2020        Assessment & Plan:    ASHD (arteriosclerotic heart disease)  Primary hypertension  S/P AAA repair  Gastroesophageal reflux disease without esophagitis  Hyperlipidemia LDL goal <70  Erectile dysfunction due to arterial insufficiency  Obesity (BMI 35.0-39.9 without comorbidity)  Former smoker  Immunization History  Administered Date(s) Administered   DTaP 04/06/2002   Fluad Quad(high Dose 65+) 03/21/2019   Influenza Split 03/22/2011, 03/13/2012   Influenza Whole 04/06/2002, 04/26/2007   Influenza-Unspecified 03/28/2013, 03/28/2014, 03/21/2019, 03/30/2021   PFIZER(Purple Top)SARS-COV-2 Vaccination 08/02/2019, 08/27/2019, 04/08/2020, 09/26/2020   Pneumococcal Conjugate-13 07/19/2013   Pneumococcal Polysaccharide-23 06/05/2007   Tdap 07/17/2012   Zoster Recombinat (Shingrix) 10/03/2020, 12/17/2020   Zoster, Live 06/05/2007    Health Maintenance  Topic Date Due   COVID-19 Vaccine (5 - Pfizer series) 03/28/2022 (Originally 11/21/2020)   INFLUENZA VACCINE  09/26/2022 (Originally 01/26/2022)   Pneumonia Vaccine 17+ Years old (3 - PPSV23 or PCV20) 10/09/2025 (Originally 07/19/2014)   TETANUS/TDAP  07/17/2022   Hepatitis C Screening  Completed   Zoster Vaccines- Shingrix  Completed   HPV VACCINES  Aged Out   Fecal DNA (Cologuard)  Discontinued   Continue on present medication regimen. Discussed health benefits of physical  activity, and encouraged him to engage in regular exercise appropriate for his age and condition.  Problem List Items Addressed This Visit   None  Return in about 1 year (around 03/13/2023) for fasting .     Jill Alexanders, MD

## 2022-03-13 LAB — COMPREHENSIVE METABOLIC PANEL
ALT: 21 IU/L (ref 0–44)
AST: 19 IU/L (ref 0–40)
Albumin/Globulin Ratio: 1.6 (ref 1.2–2.2)
Albumin: 4.4 g/dL (ref 3.8–4.8)
Alkaline Phosphatase: 110 IU/L (ref 44–121)
BUN/Creatinine Ratio: 9 — ABNORMAL LOW (ref 10–24)
BUN: 12 mg/dL (ref 8–27)
Bilirubin Total: 1 mg/dL (ref 0.0–1.2)
CO2: 26 mmol/L (ref 20–29)
Calcium: 9.6 mg/dL (ref 8.6–10.2)
Chloride: 100 mmol/L (ref 96–106)
Creatinine, Ser: 1.29 mg/dL — ABNORMAL HIGH (ref 0.76–1.27)
Globulin, Total: 2.8 g/dL (ref 1.5–4.5)
Glucose: 103 mg/dL — ABNORMAL HIGH (ref 70–99)
Potassium: 5 mmol/L (ref 3.5–5.2)
Sodium: 140 mmol/L (ref 134–144)
Total Protein: 7.2 g/dL (ref 6.0–8.5)
eGFR: 57 mL/min/{1.73_m2} — ABNORMAL LOW (ref >59)

## 2022-03-13 LAB — CBC WITH DIFFERENTIAL/PLATELET
Basophils Absolute: 0.1 10*3/uL (ref 0.0–0.2)
Basos: 1 %
EOS (ABSOLUTE): 0.2 10*3/uL (ref 0.0–0.4)
Eos: 2 %
Hematocrit: 46.9 % (ref 37.5–51.0)
Hemoglobin: 16.3 g/dL (ref 13.0–17.7)
Immature Grans (Abs): 0.1 10*3/uL (ref 0.0–0.1)
Immature Granulocytes: 1 %
Lymphocytes Absolute: 2.7 10*3/uL (ref 0.7–3.1)
Lymphs: 41 %
MCH: 31.9 pg (ref 26.6–33.0)
MCHC: 34.8 g/dL (ref 31.5–35.7)
MCV: 92 fL (ref 79–97)
Monocytes Absolute: 0.6 10*3/uL (ref 0.1–0.9)
Monocytes: 9 %
Neutrophils Absolute: 3.1 10*3/uL (ref 1.4–7.0)
Neutrophils: 46 %
Platelets: 184 10*3/uL (ref 150–450)
RBC: 5.11 x10E6/uL (ref 4.14–5.80)
RDW: 13.3 % (ref 11.6–15.4)
WBC: 6.6 10*3/uL (ref 3.4–10.8)

## 2022-03-13 LAB — LIPID PANEL
Chol/HDL Ratio: 3.2 ratio (ref 0.0–5.0)
Cholesterol, Total: 161 mg/dL (ref 100–199)
HDL: 51 mg/dL (ref >39)
LDL Chol Calc (NIH): 84 mg/dL (ref 0–99)
Triglycerides: 152 mg/dL — ABNORMAL HIGH (ref 0–149)
VLDL Cholesterol Cal: 26 mg/dL (ref 5–40)

## 2022-03-18 ENCOUNTER — Other Ambulatory Visit: Payer: Self-pay | Admitting: Family Medicine

## 2022-03-18 DIAGNOSIS — I10 Essential (primary) hypertension: Secondary | ICD-10-CM

## 2022-04-06 ENCOUNTER — Encounter: Payer: Self-pay | Admitting: Internal Medicine

## 2022-04-19 ENCOUNTER — Encounter: Payer: Self-pay | Admitting: Internal Medicine

## 2022-06-25 DIAGNOSIS — Z20822 Contact with and (suspected) exposure to covid-19: Secondary | ICD-10-CM | POA: Diagnosis not present

## 2022-06-26 DIAGNOSIS — Z20822 Contact with and (suspected) exposure to covid-19: Secondary | ICD-10-CM | POA: Diagnosis not present

## 2022-07-20 DIAGNOSIS — Z20822 Contact with and (suspected) exposure to covid-19: Secondary | ICD-10-CM | POA: Diagnosis not present

## 2022-07-22 DIAGNOSIS — Z20822 Contact with and (suspected) exposure to covid-19: Secondary | ICD-10-CM | POA: Diagnosis not present

## 2022-07-24 DIAGNOSIS — Z20822 Contact with and (suspected) exposure to covid-19: Secondary | ICD-10-CM | POA: Diagnosis not present

## 2022-07-25 DIAGNOSIS — Z20822 Contact with and (suspected) exposure to covid-19: Secondary | ICD-10-CM | POA: Diagnosis not present

## 2022-07-26 DIAGNOSIS — Z20822 Contact with and (suspected) exposure to covid-19: Secondary | ICD-10-CM | POA: Diagnosis not present

## 2022-07-29 DIAGNOSIS — Z20822 Contact with and (suspected) exposure to covid-19: Secondary | ICD-10-CM | POA: Diagnosis not present

## 2022-07-31 DIAGNOSIS — Z20822 Contact with and (suspected) exposure to covid-19: Secondary | ICD-10-CM | POA: Diagnosis not present

## 2022-08-02 DIAGNOSIS — Z20822 Contact with and (suspected) exposure to covid-19: Secondary | ICD-10-CM | POA: Diagnosis not present

## 2022-08-08 ENCOUNTER — Other Ambulatory Visit: Payer: Self-pay | Admitting: Family Medicine

## 2022-08-08 DIAGNOSIS — I10 Essential (primary) hypertension: Secondary | ICD-10-CM

## 2022-09-19 ENCOUNTER — Other Ambulatory Visit: Payer: Self-pay | Admitting: Family Medicine

## 2022-09-19 DIAGNOSIS — I10 Essential (primary) hypertension: Secondary | ICD-10-CM

## 2023-01-02 ENCOUNTER — Other Ambulatory Visit: Payer: Self-pay | Admitting: Family Medicine

## 2023-01-02 DIAGNOSIS — I2510 Atherosclerotic heart disease of native coronary artery without angina pectoris: Secondary | ICD-10-CM

## 2023-01-02 DIAGNOSIS — E785 Hyperlipidemia, unspecified: Secondary | ICD-10-CM

## 2023-01-02 DIAGNOSIS — I10 Essential (primary) hypertension: Secondary | ICD-10-CM

## 2023-01-02 DIAGNOSIS — K219 Gastro-esophageal reflux disease without esophagitis: Secondary | ICD-10-CM

## 2023-02-20 ENCOUNTER — Other Ambulatory Visit: Payer: Self-pay | Admitting: Family Medicine

## 2023-02-20 DIAGNOSIS — I10 Essential (primary) hypertension: Secondary | ICD-10-CM

## 2023-02-23 ENCOUNTER — Other Ambulatory Visit: Payer: Self-pay

## 2023-02-23 DIAGNOSIS — Z8679 Personal history of other diseases of the circulatory system: Secondary | ICD-10-CM

## 2023-03-11 ENCOUNTER — Ambulatory Visit
Admission: RE | Admit: 2023-03-11 | Discharge: 2023-03-11 | Disposition: A | Discharge status: Home / Self Care | Payer: Medicare HMO | Source: Ambulatory Visit | Attending: Vascular Surgery | Admitting: Vascular Surgery

## 2023-03-11 DIAGNOSIS — Z9889 Other specified postprocedural states: Secondary | ICD-10-CM | POA: Diagnosis not present

## 2023-03-11 DIAGNOSIS — I700 Atherosclerosis of aorta: Secondary | ICD-10-CM | POA: Diagnosis not present

## 2023-03-11 DIAGNOSIS — Z8679 Personal history of other diseases of the circulatory system: Secondary | ICD-10-CM

## 2023-03-11 MED ORDER — IOPAMIDOL (ISOVUE-370) INJECTION 76%
200.0000 mL | Freq: Once | INTRAVENOUS | Status: AC | PRN
  Administered 2023-03-11: 75 mL via INTRAVENOUS

## 2023-03-14 NOTE — Progress Notes (Unsigned)
[facility] [place]  ASSESSMENT / PLAN: 77 y.o. male status post EVAR for abdominal aortic aneurysm in 2014.  CT angiogram shows no technical problems with the repair.  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  It will need attention in follow-up.  Follow-up with me in 1 year with abdominal aortic duplex.  CHIEF COMPLAINT: EVAR follow-up  HISTORY OF PRESENT ILLNESS: Martin Dickerson is a 77 y.o. male who underwent EVAR for abdominal aortic aneurysm in 2014 with my partner Dr. Arbie Cookey.  He presents today for follow-up.  He reports he has missed about 5 years of follow-up.  He has no complaints related to his abdomen.  We reviewed his CT scan in detail.  Past Medical History:  Diagnosis Date   AAA (abdominal aortic aneurysm) ([facility]) 2007   ASHD (arteriosclerotic heart disease)    ED (erectile dysfunction)    Emphysema     a "Touch" per pt   GERD (gastroesophageal reflux disease)    takes Zantac nightly   Hemorrhoids    History of colon polyps    Hyperlipidemia    takes Lipitor daily   Hypertension    takes Metoprolol and HCTZ daily   Myocardial infarction [facility]) 2002   has never had to take Nitroglycerin   Obesity    PONV (postoperative nausea and vomiting)    SVT (supraventricular tachycardia)     Past Surgical History:  Procedure Laterality Date   ABDOMINAL AORTIC ENDOVASCULAR STENT GRAFT N/A 08/28/2012   Procedure: ABDOMINAL AORTIC ENDOVASCULAR STENT GRAFT;  Surgeon: Larina Earthly, MD;  Location: [facility] OR;  Service: Vascular;  Laterality: N/A;  Ultrasound guided   CARDIAC CATHETERIZATION  03/22/2001   EF 40%   CARDIOVASCULAR STRESS TEST  01/31/2007   EF 68% NO EVIDENCE OF ISCHEMIA   COLONOSCOPY     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   quadruple   ERCP     IR US GUIDE VASC ACCESS LEFT  08/15/2019   US ECHOCARDIOGRAPHY  10/26/2005   EF 55-60%    Family History  Problem Relation Age of  Onset   Stroke Mother    Hypertension Father    Heart disease Maternal Uncle     Social History   Socioeconomic History   Marital status: Married    Spouse name: Not on file   Number of children: Not on file   Years of education: Not on file   Highest education level: Not on file  Occupational History   Not on file  Tobacco Use   Smoking status: Former    Current packs/day: 0.00    Types: Cigarettes    Start date: 06/28/1965    Quit date: 06/28/2000    Years since quitting: 22.7   Smokeless tobacco: Never   Tobacco comments:    quit in 2002  Vaping Use   Vaping status: Never Used  Substance and Sexual Activity   Alcohol use: No   Drug use: No   Sexual activity: Yes  Other Topics Concern   Not on file  Social History Narrative   Not on file   Social Determinants of Health   Financial Resource Strain: Low Risk  (02/26/2022)   Overall Financial Resource Strain (CARDIA)    Difficulty of Paying Living Expenses: Not hard at all  Food Insecurity: No Food Insecurity (02/26/2022)   Hunger Vital Sign    Worried About Running Out of  Food in the Last Year: Never true    Ran Out of Food in the Last Year: Never true  Transportation Needs: No Transportation Needs (02/26/2022)   PRAPARE - Administrator, Civil Service (Medical): No    Lack of Transportation (Non-Medical): No  Physical Activity: Insufficiently Active (02/26/2022)   Exercise Vital Sign    Days of Exercise per Week: 2 days    Minutes of Exercise per Session: 30 min  Stress: No Stress Concern Present (02/26/2022)   [employer] of Occupational Health - Occupational Stress Questionnaire    Feeling of Stress : Only a little  Social Connections: Not on file  Intimate Partner Violence: Not on file    No Known Allergies  Current Outpatient Medications  Medication Sig Dispense Refill   aspirin EC 81 MG tablet Take 81 mg by mouth daily.     atorvastatin (LIPITOR) 20 MG tablet TAKE 1 TABLET AT BEDTIME 90  tablet 0   hydrochlorothiazide (HYDRODIURIL) 25 MG tablet TAKE 1 TABLET EVERY DAY (NEED MD APPOINTMENT) 90 tablet 1   metoprolol tartrate (LOPRESSOR) 25 MG tablet TAKE 1 TABLET TWICE DAILY 180 tablet 0   nitroGLYCERIN (NITROSTAT) 0.4 MG SL tablet DISSOLVE ONE TABLET UNDER THE TONGUE EVERY 5 MINUTES AS NEEDED FOR CHEST PAIN. 75 tablet 0   pantoprazole (PROTONIX) 40 MG tablet TAKE 1 TABLET EVERY DAY 90 tablet 0   potassium chloride (KLOR-CON M) 10 MEQ tablet TAKE 1 TABLET EVERY DAY 90 tablet 0   methylPREDNISolone (MEDROL) 4 MG tablet Take as directed (Patient not taking: Reported on 10/01/2020) 21 tablet 0   No current facility-administered medications for this visit.    PHYSICAL EXAM Vitals:   03/15/23 1113  BP: 138/64  Pulse: 65  Resp: 20  Temp: 98.4 F (36.9 C)  SpO2: 97%  Weight: 252 lb (114.3 kg)  Height: 5\' 11"  (1.803 m)    The man in no distress Regular rate and rhythm Unlabored breathing Soft nontender abdomen   PERTINENT LABORATORY AND RADIOLOGIC DATA  Most recent CBC    Latest Ref Rng & Units 03/12/2022    1:25 PM 10/01/2020    1:31 PM 11/28/2018    2:38 PM  CBC  WBC 3.4 - 10.8 x10E3/uL 6.6  6.8  7.7   Hemoglobin 13.0 - 17.7 g/dL 01.0  27.2  53.6   Hematocrit 37.5 - 51.0 % 46.9  49.7  46.2   Platelets 150 - 450 x10E3/uL 184  152  178      Most recent CMP    Latest Ref Rng & Units 03/12/2022    1:25 PM 10/01/2020    1:31 PM 08/09/2019    3:11 PM  CMP  Glucose 70 - 99 mg/dL 644  98    BUN 8 - 27 mg/dL 12  12  12    Creatinine 0.76 - 1.27 mg/dL 0.34  7.42  5.95   Sodium 134 - 144 mmol/L 140  141    Potassium 3.5 - 5.2 mmol/L 5.0  5.4    Chloride 96 - 106 mmol/L 100  100    CO2 20 - 29 mmol/L 26  22    Calcium 8.6 - 10.2 mg/dL 9.6  63.8    Total Protein 6.0 - 8.5 g/dL 7.2  7.5    Total Bilirubin 0.0 - 1.2 mg/dL 1.0  1.0    Alkaline Phos 44 - 121 IU/L 110  110    AST 0 - 40 IU/L 19  25  ALT 0 - 44 IU/L 21  27      Renal function CrCl cannot be calculated  (Patient's most recent lab result is older than the maximum 21 days allowed.).  No results found for: "HGBA1C"  LDL Chol Calc (NIH)  Date Value Ref Range Status  03/12/2022 84 0 - 99 mg/dL Final    CT angiogram 7/82/9562.  Personally reviewed.  EVAR in place with good seal proximally distally.  No evidence of endoleak.  The native sac is 51 mm in greatest dimension.  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  Rande Brunt. Lenell Antu, MD [facility] Phone Number: [phone] 03/15/2023 12:02 PM   Total time spent on preparing this encounter including chart review, data review, collecting history, examining the patient, coordinating care for this established patient, 30 minutes.  Portions of this report may have been transcribed using voice recognition software.  Every effort has been made to ensure accuracy; however, inadvertent computerized transcription errors may still be present.

## 2023-03-15 ENCOUNTER — Ambulatory Visit: Payer: Medicare HMO | Admitting: Vascular Surgery

## 2023-03-15 ENCOUNTER — Encounter: Payer: Self-pay | Admitting: Vascular Surgery

## 2023-03-15 VITALS — BP 138/64 | HR 65 | Temp 98.4°F | Resp 20 | Ht 71.0 in | Wt 252.0 lb

## 2023-03-15 DIAGNOSIS — Z9889 Other specified postprocedural states: Secondary | ICD-10-CM | POA: Diagnosis not present

## 2023-03-15 DIAGNOSIS — Z8679 Personal history of other diseases of the circulatory system: Secondary | ICD-10-CM

## 2023-03-17 ENCOUNTER — Encounter: Payer: Self-pay | Admitting: Family Medicine

## 2023-03-17 ENCOUNTER — Other Ambulatory Visit: Payer: Self-pay | Admitting: Family Medicine

## 2023-03-17 ENCOUNTER — Ambulatory Visit (INDEPENDENT_AMBULATORY_CARE_PROVIDER_SITE_OTHER): Payer: Medicare HMO | Admitting: Family Medicine

## 2023-03-17 VITALS — BP 134/70 | HR 68 | Ht 71.0 in | Wt 251.2 lb

## 2023-03-17 DIAGNOSIS — Z9889 Other specified postprocedural states: Secondary | ICD-10-CM

## 2023-03-17 DIAGNOSIS — E785 Hyperlipidemia, unspecified: Secondary | ICD-10-CM

## 2023-03-17 DIAGNOSIS — K219 Gastro-esophageal reflux disease without esophagitis: Secondary | ICD-10-CM | POA: Diagnosis not present

## 2023-03-17 DIAGNOSIS — Z23 Encounter for immunization: Secondary | ICD-10-CM | POA: Diagnosis not present

## 2023-03-17 DIAGNOSIS — Z8679 Personal history of other diseases of the circulatory system: Secondary | ICD-10-CM

## 2023-03-17 DIAGNOSIS — I2510 Atherosclerotic heart disease of native coronary artery without angina pectoris: Secondary | ICD-10-CM | POA: Diagnosis not present

## 2023-03-17 DIAGNOSIS — Z6379 Other stressful life events affecting family and household: Secondary | ICD-10-CM

## 2023-03-17 DIAGNOSIS — I10 Essential (primary) hypertension: Secondary | ICD-10-CM

## 2023-03-17 DIAGNOSIS — Z0000 Encounter for general adult medical examination without abnormal findings: Secondary | ICD-10-CM | POA: Diagnosis not present

## 2023-03-17 DIAGNOSIS — N5201 Erectile dysfunction due to arterial insufficiency: Secondary | ICD-10-CM

## 2023-03-17 DIAGNOSIS — R7309 Other abnormal glucose: Secondary | ICD-10-CM | POA: Diagnosis not present

## 2023-03-17 LAB — LIPID PANEL

## 2023-03-17 MED ORDER — ATORVASTATIN CALCIUM 20 MG PO TABS
20.0000 mg | ORAL_TABLET | Freq: Every day | ORAL | 3 refills | Status: DC
Start: 2023-03-17 — End: 2023-03-23

## 2023-03-17 MED ORDER — HYDROCHLOROTHIAZIDE 25 MG PO TABS: ORAL_TABLET | ORAL | 3 refills | Status: DC

## 2023-03-17 MED ORDER — METOPROLOL TARTRATE 25 MG PO TABS
25.0000 mg | ORAL_TABLET | Freq: Two times a day (BID) | ORAL | 3 refills | Status: DC
Start: 2023-03-17 — End: 2023-03-23

## 2023-03-17 MED ORDER — PANTOPRAZOLE SODIUM 40 MG PO TBEC
40.0000 mg | DELAYED_RELEASE_TABLET | Freq: Every day | ORAL | 3 refills | Status: DC
Start: 2023-03-17 — End: 2023-03-23

## 2023-03-17 NOTE — Progress Notes (Signed)
Martin Dickerson is a 77 y.o. male who presents for annual wellness visit ,CPEand follow-up on chronic medical conditions.  He is now taking care of his wife who apparently has Alzheimer's disease.  He is helping her wash her hair, feed her and choose her clothing.  At this point he is not getting any help handling all these things.  He continues on his atorvastatin as well as metoprolol and HCTZ.  He does have nitroglycerin if he needs it.  He does use Protonix but on an as-needed basis.  He was recently seen by [facility] for follow-up on his AAA and things are stable.  He has not had any cardiac issues.  He has not seen cardiology in quite some time.   Immunizations and Health Maintenance Immunization History  Administered Date(s) Administered   DTaP 04/06/2002   Fluad Quad(high Dose 65+) 03/21/2019   Influenza Split 03/22/2011, 03/13/2012   Influenza Whole 04/06/2002, 04/26/2007   Influenza-Unspecified 03/28/2013, 03/28/2014, 03/21/2019, 03/30/2021   PFIZER(Purple Top)SARS-COV-2 Vaccination 08/02/2019, 08/27/2019, 04/08/2020, 09/26/2020   Pneumococcal Conjugate-13 07/19/2013   Pneumococcal Polysaccharide-23 06/05/2007   Tdap 07/17/2012   Zoster Recombinant(Shingrix) 10/03/2020, 12/17/2020   Zoster, Live 06/05/2007   Health Maintenance Due  Topic Date Due   DTaP/Tdap/Td (3 - Td or Tdap) 07/17/2022   Medicare Annual Wellness (AWV)  02/27/2023    Last colonoscopy: Jan 2009 Last PSA: Dentist: N/A Ophtho: Regular eye care at [facility] Exercise: Climb steps about 16 times or more a day  Other doctors caring for patient include: Dr. Heath Lark, Vascular Surgeon   Advanced Directives:  Living Will (has not been notarized)    Depression screen:  See questionnaire below.        02/26/2022   12:02 PM 10/01/2020   11:15 AM 11/28/2018    1:42 PM 11/11/2017    9:08 AM 09/02/2016   10:07 AM  Depression screen PHQ 2/9  Decreased Interest 0 0 0 0 0  Down, Depressed, Hopeless 0 0 0 0 0   PHQ - 2 Score 0 0 0 0 0  Altered sleeping 0      Tired, decreased energy 0      Change in appetite 0      Feeling bad or failure about yourself  0      Trouble concentrating 0      Moving slowly or fidgety/restless 0      Suicidal thoughts 0      PHQ-9 Score 0      Difficult doing work/chores Not difficult at all        Fall Screen: See Questionaire below.      02/26/2022   12:02 PM 02/22/2022   10:11 AM 10/01/2020   11:14 AM 11/28/2018    1:42 PM 11/11/2017    9:08 AM  Fall Risk   Falls in the past year? 0 0 0 0 No  Number falls in past yr: 0 0 0    Injury with Fall? 0 0 0    Risk for fall due to : Medication side effect  No Fall Risks    Follow up Falls prevention discussed;Education provided;Falls evaluation completed  Falls evaluation completed      ADL screen:  See questionnaire below.  Functional Status Survey:     Review of Systems  Constitutional: -, -unexpected weight change, -anorexia, -fatigue Allergy: -sneezing, -itching, -congestion Dermatology: denies changing moles, rash, lumps ENT: -runny nose, -ear pain, -sore throat,  Cardiology:  -chest pain, -palpitations, -  orthopnea, Respiratory: -cough, -shortness of breath, -dyspnea on exertion, -wheezing,  Gastroenterology: -abdominal pain, -nausea, -vomiting, -diarrhea, -constipation, -dysphagia Hematology: -bleeding or bruising problems Musculoskeletal: -arthralgias, -myalgias, -joint swelling, -back pain, - Ophthalmology: -vision changes,  Urology: -dysuria, -difficulty urinating,  -urinary frequency, -urgency, incontinence Neurology: -, -numbness, , -memory loss, -falls, -dizziness    PHYSICAL EXAM:   General Appearance: Alert, cooperative, no distress, appears stated age Head: Normocephalic, without obvious abnormality, atraumatic Eyes: PERRL, conjunctiva/corneas clear, EOM's intact,  Ears: Normal TM's and external ear canals Nose: Nares normal, mucosa normal, no drainage or sinus   tenderness Throat:  Lips, mucosa, and tongue normal; teeth and gums normal Neck: Supple, no lymphadenopathy, thyroid:no enlargement/tenderness/nodules; no carotid bruit or JVD Lungs: Clear to auscultation bilaterally without wheezes, rales or ronchi; respirations unlabored Heart: Regular rate and rhythm, S1 and S2 normal, no murmur, rub or gallop Abdomen: Soft, non-tender, nondistended, normoactive bowel sounds, no masses, no hepatosplenomegaly Extremities: No clubbing, cyanosis or edema Pulses: 2+ and symmetric all extremities Skin: Skin color, texture, turgor normal, no rashes or lesions Lymph nodes: Cervical, supraclavicular, and axillary nodes normal Neurologic: CNII-XII intact, normal strength, sensation and gait; reflexes 2+ and symmetric throughout   Psych: Normal mood, affect, hygiene and grooming  ASSESSMENT/PLAN: Routine general medical examination at a health care facility  S/P AAA repair  Primary hypertension  Hyperlipidemia LDL goal <70  Gastroesophageal reflux disease without esophagitis  Erectile dysfunction due to arterial insufficiency  ASHD (arteriosclerotic heart disease)  Essential hypertension  Need for COVID-19 vaccine  Need for influenza vaccination  On his present medication regimen.  I had a long discussion with him concerning care for his wife and will get home health involved to see what kinds of things that he would qualify for to help take care of her.  Also recommended that he get involved in support groups for Alzheimer's patients.  Plan to follow-up on all of this in about 3 to 4 months.  He will get his immunizations in the next week or so.   Medicare Attestation I have personally reviewed: The patient's medical and social history Their use of alcohol, tobacco or illicit drugs Their current medications and supplements The patient's functional ability including ADLs,fall risks, home safety risks, cognitive, and hearing and visual impairment Diet and physical  activities Evidence for depression or mood disorders  The patient's weight, height, and BMI have been recorded in the chart.  I have made referrals, counseling, and provided education to the patient based on review of the above and I have provided the patient with a written personalized care plan for preventive services.     Sharlot Gowda, MD   03/17/2023

## 2023-03-17 NOTE — Patient Instructions (Signed)
  Martin Dickerson , Thank you for taking time to come for your Medicare Wellness Visit. I appreciate your ongoing commitment to your health goals. Please review the following plan we discussed and let me know if I can assist you in the future.   These are the goals we discussed: I will get him set up to have home health come by to see what kinds of services they can provide for him and his wife.  Find a follow-up on this in about 3 to 4 months.  Continue present medications.   This is a list of the screening recommended for you and due dates:  Health Maintenance  Topic Date Due   DTaP/Tdap/Td vaccine (3 - Td or Tdap) 07/17/2022   COVID-19 Vaccine (5 - 2023-24 season) 04/02/2023*   Flu Shot  09/26/2023*   Pneumonia Vaccine (3 of 3 - PPSV23 or PCV20) 10/09/2025*   Medicare Annual Wellness Visit  03/16/2024   Hepatitis C Screening  Completed   Zoster (Shingles) Vaccine  Completed   HPV Vaccine  Aged Out   Cologuard (Stool DNA test)  Discontinued  *Topic was postponed. The date shown is not the original due date.

## 2023-03-18 ENCOUNTER — Other Ambulatory Visit: Payer: Self-pay

## 2023-03-18 ENCOUNTER — Ambulatory Visit: Payer: Medicare HMO | Admitting: Family Medicine

## 2023-03-18 DIAGNOSIS — Z8679 Personal history of other diseases of the circulatory system: Secondary | ICD-10-CM

## 2023-03-18 LAB — LIPID PANEL
Chol/HDL Ratio: 3.4 ratio (ref 0.0–5.0)
Cholesterol, Total: 155 mg/dL (ref 100–199)
HDL: 45 mg/dL (ref >39)
LDL Chol Calc (NIH): 81 mg/dL (ref 0–99)
Triglycerides: 167 mg/dL — ABNORMAL HIGH (ref 0–149)
VLDL Cholesterol Cal: 29 mg/dL (ref 5–40)

## 2023-03-18 LAB — CBC WITH DIFFERENTIAL/PLATELET
Basophils Absolute: 0.1 10*3/uL (ref 0.0–0.2)
Basos: 1 %
EOS (ABSOLUTE): 0.2 10*3/uL (ref 0.0–0.4)
Eos: 2 %
Hematocrit: 47.2 % (ref 37.5–51.0)
Hemoglobin: 15.6 g/dL (ref 13.0–17.7)
Immature Grans (Abs): 0.1 10*3/uL (ref 0.0–0.1)
Immature Granulocytes: 1 %
Lymphocytes Absolute: 2.3 10*3/uL (ref 0.7–3.1)
Lymphs: 32 %
MCH: 31.5 pg (ref 26.6–33.0)
MCHC: 33.1 g/dL (ref 31.5–35.7)
MCV: 95 fL (ref 79–97)
Monocytes Absolute: 0.6 10*3/uL (ref 0.1–0.9)
Monocytes: 8 %
Neutrophils Absolute: 4.1 10*3/uL (ref 1.4–7.0)
Neutrophils: 56 %
Platelets: 176 10*3/uL (ref 150–450)
RBC: 4.96 x10E6/uL (ref 4.14–5.80)
RDW: 13.2 % (ref 11.6–15.4)
WBC: 7.2 10*3/uL (ref 3.4–10.8)

## 2023-03-18 LAB — COMPREHENSIVE METABOLIC PANEL
ALT: 23 IU/L (ref 0–44)
AST: 21 IU/L (ref 0–40)
Albumin: 4.4 g/dL (ref 3.8–4.8)
Alkaline Phosphatase: 118 IU/L (ref 44–121)
BUN/Creatinine Ratio: 9 — ABNORMAL LOW (ref 10–24)
BUN: 12 mg/dL (ref 8–27)
Bilirubin Total: 0.9 mg/dL (ref 0.0–1.2)
CO2: 24 mmol/L (ref 20–29)
Calcium: 9.7 mg/dL (ref 8.6–10.2)
Chloride: 99 mmol/L (ref 96–106)
Creatinine, Ser: 1.38 mg/dL — ABNORMAL HIGH (ref 0.76–1.27)
Globulin, Total: 2.7 g/dL (ref 1.5–4.5)
Glucose: 113 mg/dL — ABNORMAL HIGH (ref 70–99)
Potassium: 4.5 mmol/L (ref 3.5–5.2)
Sodium: 140 mmol/L (ref 134–144)
Total Protein: 7.1 g/dL (ref 6.0–8.5)
eGFR: 52 mL/min/{1.73_m2} — ABNORMAL LOW (ref >59)

## 2023-03-22 LAB — HGB A1C W/O EAG: Hgb A1c MFr Bld: 6.2 % — ABNORMAL HIGH (ref 4.8–5.6)

## 2023-03-22 LAB — SPECIMEN STATUS REPORT

## 2023-03-23 ENCOUNTER — Other Ambulatory Visit: Payer: Self-pay | Admitting: Family Medicine

## 2023-03-23 DIAGNOSIS — K219 Gastro-esophageal reflux disease without esophagitis: Secondary | ICD-10-CM

## 2023-03-23 DIAGNOSIS — E785 Hyperlipidemia, unspecified: Secondary | ICD-10-CM

## 2023-03-23 DIAGNOSIS — I10 Essential (primary) hypertension: Secondary | ICD-10-CM

## 2023-03-23 DIAGNOSIS — I2510 Atherosclerotic heart disease of native coronary artery without angina pectoris: Secondary | ICD-10-CM

## 2023-03-30 ENCOUNTER — Telehealth: Payer: Self-pay | Admitting: Family Medicine

## 2023-03-30 NOTE — Telephone Encounter (Signed)
Patient left message on my phone wanting someone to call him and talk about his blood sugars.  [phone]

## 2023-03-30 NOTE — Telephone Encounter (Signed)
Fax refill request  Nitroglycerin 0.4 mg

## 2023-03-30 NOTE — Telephone Encounter (Signed)
Pt.notified

## 2023-03-31 NOTE — Telephone Encounter (Signed)
Second request received from pharmacy to refill Nitroglycerin 0.4 mg

## 2023-04-01 NOTE — Telephone Encounter (Signed)
Another request for nitroglycerin

## 2023-04-05 ENCOUNTER — Telehealth: Payer: Self-pay

## 2023-04-05 DIAGNOSIS — I2510 Atherosclerotic heart disease of native coronary artery without angina pectoris: Secondary | ICD-10-CM

## 2023-04-05 DIAGNOSIS — I10 Essential (primary) hypertension: Secondary | ICD-10-CM

## 2023-04-05 MED ORDER — NITROGLYCERIN 0.4 MG SL SUBL
SUBLINGUAL_TABLET | SUBLINGUAL | 0 refills | Status: DC
Start: 2023-04-05 — End: 2023-06-22

## 2023-04-05 MED ORDER — HYDROCHLOROTHIAZIDE 25 MG PO TABS
ORAL_TABLET | ORAL | 2 refills | Status: DC
Start: 2023-04-05 — End: 2023-11-16

## 2023-04-05 NOTE — Telephone Encounter (Signed)
Pt is still taking nitroglycerin. Current rx is expired so he needs another.   Last appt. 03/17/23. Next appt. 05/03/24.

## 2023-04-05 NOTE — Telephone Encounter (Signed)
Faxed request for hydrochlorothiazide and nitroglycerin. Called pt to see if he picked up hydrochlorothiazide from [facility].

## 2023-06-18 ENCOUNTER — Other Ambulatory Visit: Payer: Self-pay | Admitting: Family Medicine

## 2023-06-18 DIAGNOSIS — I2510 Atherosclerotic heart disease of native coronary artery without angina pectoris: Secondary | ICD-10-CM

## 2023-08-17 ENCOUNTER — Encounter: Payer: Self-pay | Admitting: Internal Medicine

## 2023-08-23 ENCOUNTER — Encounter: Payer: Self-pay | Admitting: Internal Medicine

## 2023-11-16 ENCOUNTER — Other Ambulatory Visit: Payer: Self-pay | Admitting: Family Medicine

## 2023-11-16 DIAGNOSIS — I10 Essential (primary) hypertension: Secondary | ICD-10-CM

## 2024-01-11 ENCOUNTER — Other Ambulatory Visit: Payer: Self-pay | Admitting: Family Medicine

## 2024-01-11 DIAGNOSIS — I10 Essential (primary) hypertension: Secondary | ICD-10-CM

## 2024-03-14 ENCOUNTER — Other Ambulatory Visit: Payer: Self-pay | Admitting: Family Medicine

## 2024-03-14 DIAGNOSIS — I10 Essential (primary) hypertension: Secondary | ICD-10-CM

## 2024-03-14 DIAGNOSIS — K219 Gastro-esophageal reflux disease without esophagitis: Secondary | ICD-10-CM

## 2024-03-14 DIAGNOSIS — I2510 Atherosclerotic heart disease of native coronary artery without angina pectoris: Secondary | ICD-10-CM

## 2024-03-14 DIAGNOSIS — E785 Hyperlipidemia, unspecified: Secondary | ICD-10-CM

## 2024-04-11 ENCOUNTER — Other Ambulatory Visit: Payer: Self-pay | Admitting: Family Medicine

## 2024-04-11 DIAGNOSIS — I10 Essential (primary) hypertension: Secondary | ICD-10-CM

## 2024-05-03 ENCOUNTER — Ambulatory Visit: Payer: Medicare HMO | Admitting: Family Medicine

## 2024-05-03 DIAGNOSIS — N1831 Chronic kidney disease, stage 3a: Secondary | ICD-10-CM | POA: Diagnosis not present

## 2024-05-03 DIAGNOSIS — I25119 Atherosclerotic heart disease of native coronary artery with unspecified angina pectoris: Secondary | ICD-10-CM | POA: Diagnosis not present

## 2024-05-03 DIAGNOSIS — H269 Unspecified cataract: Secondary | ICD-10-CM | POA: Diagnosis not present

## 2024-05-03 DIAGNOSIS — I252 Old myocardial infarction: Secondary | ICD-10-CM | POA: Diagnosis not present

## 2024-05-03 DIAGNOSIS — E669 Obesity, unspecified: Secondary | ICD-10-CM | POA: Diagnosis not present

## 2024-05-03 DIAGNOSIS — I129 Hypertensive chronic kidney disease with stage 1 through stage 4 chronic kidney disease, or unspecified chronic kidney disease: Secondary | ICD-10-CM | POA: Diagnosis not present

## 2024-05-03 DIAGNOSIS — E876 Hypokalemia: Secondary | ICD-10-CM | POA: Diagnosis not present

## 2024-05-03 DIAGNOSIS — I700 Atherosclerosis of aorta: Secondary | ICD-10-CM | POA: Diagnosis not present

## 2024-05-03 DIAGNOSIS — K219 Gastro-esophageal reflux disease without esophagitis: Secondary | ICD-10-CM | POA: Diagnosis not present

## 2024-05-03 DIAGNOSIS — Z6831 Body mass index (BMI) 31.0-31.9, adult: Secondary | ICD-10-CM | POA: Diagnosis not present

## 2024-05-03 DIAGNOSIS — E785 Hyperlipidemia, unspecified: Secondary | ICD-10-CM | POA: Diagnosis not present

## 2024-05-22 ENCOUNTER — Other Ambulatory Visit (HOSPITAL_COMMUNITY)

## 2024-05-22 ENCOUNTER — Ambulatory Visit: Admitting: Vascular Surgery

## 2024-05-26 ENCOUNTER — Other Ambulatory Visit: Payer: Self-pay | Admitting: Family Medicine

## 2024-05-26 DIAGNOSIS — I10 Essential (primary) hypertension: Secondary | ICD-10-CM

## 2024-05-26 DIAGNOSIS — K219 Gastro-esophageal reflux disease without esophagitis: Secondary | ICD-10-CM

## 2024-05-26 DIAGNOSIS — I2510 Atherosclerotic heart disease of native coronary artery without angina pectoris: Secondary | ICD-10-CM

## 2024-05-26 DIAGNOSIS — E785 Hyperlipidemia, unspecified: Secondary | ICD-10-CM

## 2024-05-28 NOTE — Telephone Encounter (Signed)
 I called Patient to schedule missed CPE, He is unable at this time to leave his wife at all. He stated that [facility] just came to their home and did a physical on him. He will call back to schedule physical.

## 2024-06-05 ENCOUNTER — Ambulatory Visit: Admitting: Vascular Surgery

## 2024-06-05 ENCOUNTER — Other Ambulatory Visit (HOSPITAL_COMMUNITY)

## 2024-06-18 ENCOUNTER — Other Ambulatory Visit: Payer: Self-pay | Admitting: Family Medicine

## 2024-06-18 DIAGNOSIS — K219 Gastro-esophageal reflux disease without esophagitis: Secondary | ICD-10-CM

## 2024-06-18 DIAGNOSIS — E785 Hyperlipidemia, unspecified: Secondary | ICD-10-CM

## 2024-06-23 ENCOUNTER — Other Ambulatory Visit: Payer: Self-pay | Admitting: Family Medicine

## 2024-06-23 DIAGNOSIS — I10 Essential (primary) hypertension: Secondary | ICD-10-CM

## 2024-06-25 ENCOUNTER — Encounter: Payer: Self-pay | Admitting: Family Medicine

## 2024-06-25 ENCOUNTER — Ambulatory Visit: Admitting: Family Medicine

## 2024-06-25 VITALS — BP 122/82 | HR 68 | Ht 70.5 in | Wt 236.2 lb

## 2024-06-25 DIAGNOSIS — Z0000 Encounter for general adult medical examination without abnormal findings: Secondary | ICD-10-CM | POA: Diagnosis not present

## 2024-06-25 DIAGNOSIS — I10 Essential (primary) hypertension: Secondary | ICD-10-CM | POA: Diagnosis not present

## 2024-06-25 DIAGNOSIS — I2510 Atherosclerotic heart disease of native coronary artery without angina pectoris: Secondary | ICD-10-CM

## 2024-06-25 DIAGNOSIS — R7303 Prediabetes: Secondary | ICD-10-CM | POA: Diagnosis not present

## 2024-06-25 DIAGNOSIS — E785 Hyperlipidemia, unspecified: Secondary | ICD-10-CM

## 2024-06-25 LAB — CBC WITH DIFFERENTIAL/PLATELET
Basophils Absolute: 0 x10E3/uL (ref 0.0–0.2)
Basos: 0 %
EOS (ABSOLUTE): 0.1 x10E3/uL (ref 0.0–0.4)
Eos: 2 %
Hematocrit: 47.9 % (ref 37.5–51.0)
Hemoglobin: 16.5 g/dL (ref 13.0–17.7)
Immature Grans (Abs): 0 x10E3/uL (ref 0.0–0.1)
Immature Granulocytes: 0 %
Lymphocytes Absolute: 2.7 x10E3/uL (ref 0.7–3.1)
Lymphs: 40 %
MCH: 33.1 pg — ABNORMAL HIGH (ref 26.6–33.0)
MCHC: 34.4 g/dL (ref 31.5–35.7)
MCV: 96 fL (ref 79–97)
Monocytes Absolute: 0.6 x10E3/uL (ref 0.1–0.9)
Monocytes: 9 %
Neutrophils Absolute: 3.2 x10E3/uL (ref 1.4–7.0)
Neutrophils: 49 %
Platelets: 155 x10E3/uL (ref 150–450)
RBC: 4.99 x10E6/uL (ref 4.14–5.80)
RDW: 13.8 % (ref 11.6–15.4)
WBC: 6.7 x10E3/uL (ref 3.4–10.8)

## 2024-06-25 LAB — COMPREHENSIVE METABOLIC PANEL WITH GFR
ALT: 39 IU/L (ref 0–44)
AST: 30 IU/L (ref 0–40)
Albumin: 4.4 g/dL (ref 3.8–4.8)
Alkaline Phosphatase: 102 IU/L (ref 47–123)
BUN/Creatinine Ratio: 13 (ref 10–24)
BUN: 15 mg/dL (ref 8–27)
Bilirubin Total: 1.2 mg/dL (ref 0.0–1.2)
CO2: 23 mmol/L (ref 20–29)
Calcium: 9.6 mg/dL (ref 8.6–10.2)
Chloride: 97 mmol/L (ref 96–106)
Creatinine, Ser: 1.17 mg/dL (ref 0.76–1.27)
Globulin, Total: 2.8 g/dL (ref 1.5–4.5)
Glucose: 78 mg/dL (ref 70–99)
Potassium: 4.5 mmol/L (ref 3.5–5.2)
Sodium: 138 mmol/L (ref 134–144)
Total Protein: 7.2 g/dL (ref 6.0–8.5)
eGFR: 63 mL/min/1.73

## 2024-06-25 LAB — LIPID PANEL
Chol/HDL Ratio: 3.4 ratio (ref 0.0–5.0)
Cholesterol, Total: 155 mg/dL (ref 100–199)
HDL: 46 mg/dL
LDL Chol Calc (NIH): 86 mg/dL (ref 0–99)
Triglycerides: 128 mg/dL (ref 0–149)
VLDL Cholesterol Cal: 23 mg/dL (ref 5–40)

## 2024-06-25 LAB — POCT GLYCOSYLATED HEMOGLOBIN (HGB A1C): Hemoglobin A1C: 5.9 % — AB (ref 4.0–5.6)

## 2024-06-25 MED ORDER — METOPROLOL TARTRATE 25 MG PO TABS: 25.0000 mg | ORAL_TABLET | Freq: Two times a day (BID) | ORAL | 1 refills | Status: AC

## 2024-06-25 MED ORDER — ATORVASTATIN CALCIUM 20 MG PO TABS: 20.0000 mg | ORAL_TABLET | Freq: Every day | ORAL | 3 refills | Status: AC

## 2024-06-25 NOTE — Progress Notes (Signed)
" ° °  Name: Martin Dickerson   Date of Visit: 06/25/2024   Date of last visit with me: Visit date not found   CHIEF COMPLAINT:  Chief Complaint  Patient presents with   Medicare Wellness    MWV,        HPI:  Discussed the use of AI scribe software for clinical note transcription with the patient, who gave verbal consent to proceed.  History of Present Illness   He is an 78 year old male who presents for medication refills and routine blood work.  He received a medication refill in the mail two to three weeks ago, but it contained about half the count in each bottle, possibly due to missed appointments.  He is undergoing blood work, and recalls being told he was prediabetic during his last visit.         OBJECTIVE:       06/25/2024    3:16 PM  Depression screen PHQ 2/9  Decreased Interest 0  Down, Depressed, Hopeless 0  PHQ - 2 Score 0     BP Readings from Last 3 Encounters:  06/25/24 122/82  03/17/23 134/70  03/15/23 138/64    BP 122/82   Pulse 68   Ht 5' 10.5 (1.791 m)   Wt 236 lb 3.2 oz (107.1 kg)   BMI 33.41 kg/m    Physical Exam          Physical Exam Constitutional:      Appearance: Normal appearance.  Neurological:     General: No focal deficit present.     Mental Status: He is alert and oriented to person, place, and time. Mental status is at baseline.     ASSESSMENT/PLAN:   Assessment & Plan Encounter for Medicare annual wellness exam  Hyperlipidemia LDL goal <70  Essential hypertension  ASHD (arteriosclerotic heart disease)  Prediabetes    Assessment and Plan    Essential hypertension Blood pressure well-controlled with current medication. - Resent lopressor  pressure medication for 90-day supply. - Blood pressure looks stable, continue current medication.  Prediabetes Previous labs indicated prediabetes. - Performed finger prick blood test to check blood sugar levels.  -Previous A1c 6.2, repeat lab today.  Will also go ahead  and do CBC and CMP.  Annual Physical. - Up-to-date on all screenings. -Comprehensive annual physical exam completed today. Reviewed interval history, current medical issues, medications, allergies, and preventive care needs. Addressed all patient questions and concerns. Discussed lifestyle factors including diet, exercise, sleep, and stress management. Reviewed recommended age-appropriate screenings, labs, and vaccinations. Counseling provided on healthy habits and routine health maintenance. Follow-up as indicated based on findings and results.         Hedda Crumbley A. Vita MD [facility] "

## 2024-06-25 NOTE — Progress Notes (Signed)
 "  Chief Complaint  Patient presents with   Medicare Wellness    MWV,      Subjective:   Martin Dickerson is a 78 y.o. male who presents for a Medicare Annual Wellness Visit.  Visit info / Clinical Intake: Medicare Wellness Visit Type:: Subsequent Annual Wellness Visit Persons participating in visit and providing information:: patient Medicare Wellness Visit Mode:: In-person (required for WTM) Interpreter Needed?: No Pre-visit prep was completed: yes AWV questionnaire completed by patient prior to visit?: no Living arrangements:: lives with spouse/significant other Patient's Overall Health Status Rating: good Typical amount of pain: none Does pain affect daily life?: no Are you currently prescribed opioids?: no  Dietary Habits and Nutritional Risks How many meals a day?: (!) 1 Eats fruit and vegetables daily?: (!) no Most meals are obtained by: preparing own meals In the last 2 weeks, have you had any of the following?: none Diabetic:: no  Functional Status Activities of Daily Living (to include ambulation/medication): Independent Ambulation: Independent Medication Administration: Independent Home Management (perform basic housework or laundry): Independent Manage your own finances?: yes Primary transportation is: driving Concerns about vision?: no *vision screening is required for WTM* Concerns about hearing?: no  Fall Screening Falls in the past year?: 0 Number of falls in past year: 0 Was there an injury with Fall?: 0 Fall Risk Category Calculator: 0 Patient Fall Risk Level: Low Fall Risk  Fall Risk Patient at Risk for Falls Due to: No Fall Risks Fall risk Follow up: Falls evaluation completed  Home and Transportation Safety: All rugs have non-skid backing?: yes All stairs or steps have railings?: yes Grab bars in the bathtub or shower?: yes Have non-skid surface in bathtub or shower?: yes Good home lighting?: yes Regular seat belt use?: yes Hospital stays in  the last year:: no  Cognitive Assessment Difficulty concentrating, remembering, or making decisions? : no Will 6CIT or Mini Cog be Completed: no  Advance Directives (For Healthcare) Does Patient Have a Medical Advance Directive?: Yes Does patient want to make changes to medical advance directive?: No - Patient declined Type of Advance Directive: Healthcare Power of [place]; Living will Copy of Healthcare Power of Attorney in Chart?: No - copy requested Copy of Living Will in Chart?: No - copy requested    Allergies (verified) Patient has no known allergies.   Current Medications (verified) Outpatient Encounter Medications as of 06/25/2024  Medication Sig   aspirin  EC 81 MG tablet Take 81 mg by mouth daily.   atorvastatin  (LIPITOR) 20 MG tablet TAKE 1 TABLET AT BEDTIME   hydrochlorothiazide  (HYDRODIURIL ) 25 MG tablet TAKE 1 TABLET EVERY DAY (NEED MD APPOINTMENT)   metoprolol  tartrate (LOPRESSOR ) 25 MG tablet TAKE 1 TABLET TWICE DAILY   nitroGLYCERIN  (NITROSTAT ) 0.4 MG SL tablet DISSOLVE ONE TABLET UNDER THE TONGUE EVERY 5 MINUTES AS NEEDED FOR CHEST PAIN.   pantoprazole  (PROTONIX ) 40 MG tablet TAKE 1 TABLET EVERY DAY   potassium chloride  (KLOR-CON  M) 10 MEQ tablet TAKE 1 TABLET EVERY DAY   methylPREDNISolone  (MEDROL ) 4 MG tablet Take as directed (Patient not taking: Reported on 06/25/2024)   No facility-administered encounter medications on file as of 06/25/2024.    History: Past Medical History:  Diagnosis Date   AAA (abdominal aortic aneurysm) 2007   ASHD (arteriosclerotic heart disease)    ED (erectile dysfunction)    Emphysema     a Touch per pt   GERD (gastroesophageal reflux disease)    takes Zantac  nightly   Hemorrhoids  History of colon polyps    Hyperlipidemia    takes Lipitor daily   Hypertension    takes Metoprolol  and HCTZ daily   Myocardial infarction [facility]) 2002   has never had to take Nitroglycerin    Obesity    PONV (postoperative nausea and  vomiting)    SVT (supraventricular tachycardia)    Past Surgical History:  Procedure Laterality Date   ABDOMINAL AORTIC ENDOVASCULAR STENT GRAFT N/A 08/28/2012   Procedure: ABDOMINAL AORTIC ENDOVASCULAR STENT GRAFT;  Surgeon: Krystal JULIANNA Doing, MD;  Location: [facility] OR;  Service: Vascular;  Laterality: N/A;  Ultrasound guided   CARDIAC CATHETERIZATION  03/22/2001   EF 40%   CARDIOVASCULAR STRESS TEST  01/31/2007   EF 68% NO EVIDENCE OF ISCHEMIA   COLONOSCOPY     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   quadruple   ERCP     IR US  GUIDE VASC ACCESS LEFT  08/15/2019   US  ECHOCARDIOGRAPHY  10/26/2005   EF 55-60%   Family History  Problem Relation Age of Onset   Stroke Mother    Hypertension Father    Heart disease Maternal Uncle    Social History   Occupational History   Not on file  Tobacco Use   Smoking status: Former    Current packs/day: 0.00    Types: Cigarettes    Start date: 06/28/1965    Quit date: 06/28/2000    Years since quitting: 24.0   Smokeless tobacco: Never   Tobacco comments:    quit in 2002  Vaping Use   Vaping status: Never Used  Substance and Sexual Activity   Alcohol use: No   Drug use: No   Sexual activity: Yes   Tobacco Counseling Counseling given: Not Answered Tobacco comments: quit in 2002  SDOH Screenings   Food Insecurity: No Food Insecurity (03/17/2023)  Housing: Low Risk (03/17/2023)  Transportation Needs: No Transportation Needs (03/17/2023)  Utilities: Not At Risk (03/17/2023)  Depression (PHQ2-9): Low Risk (06/25/2024)  Financial Resource Strain: Medium Risk (03/17/2023)  Physical Activity: Insufficiently Active (03/17/2023)  Social Connections: Moderately Isolated (03/17/2023)  Stress: No Stress Concern Present (03/17/2023)  Tobacco Use: Medium Risk (06/25/2024)   See flowsheets for full screening details  Depression Screen PHQ 2 & 9 Depression Scale- Over the past 2 weeks, how often have you been bothered by any of the following problems? Little  interest or pleasure in doing things: 0 Feeling down, depressed, or hopeless (PHQ Adolescent also includes...irritable): 0 PHQ-2 Total Score: 0     Goals Addressed   None          Objective:    Today's Vitals   06/25/24 1521  BP: 122/82  Pulse: 68  Weight: 236 lb 3.2 oz (107.1 kg)  Height: 5' 10.5 (1.791 m)   Body mass index is 33.41 kg/m.  Hearing/Vision screen No results found. Immunizations and Health Maintenance Health Maintenance  Topic Date Due   DTaP/Tdap/Td (3 - Td or Tdap) 07/17/2022   Medicare Annual Wellness (AWV)  03/16/2024   Pneumococcal Vaccine: 50+ Years (3 of 3 - PCV20 or PCV21) 10/09/2025 (Originally 07/19/2018)   COVID-19 Vaccine (6 - 2025-26 season) 10/19/2024   Influenza Vaccine  Completed   Zoster Vaccines- Shingrix  Completed   Meningococcal B Vaccine  Aged Out   Hepatitis C Screening  Discontinued   Fecal DNA (Cologuard)  Discontinued        Assessment/Plan:  This is a routine wellness examination for [place].  Patient Care Team: Joyce Rush  C, MD as PCP - General (Family Medicine) Early, Krystal FALCON, MD (Inactive) as Attending Physician (Vascular Surgery)  I have personally reviewed and noted the following in the patients chart:   Medical and social history Use of alcohol, tobacco or illicit drugs  Current medications and supplements including opioid prescriptions. Functional ability and status Nutritional status Physical activity Advanced directives List of other physicians Hospitalizations, surgeries, and ER visits in previous 12 months Vitals Screenings to include cognitive, depression, and falls Referrals and appointments  No orders of the defined types were placed in this encounter.  In addition, I have reviewed and discussed with patient certain preventive protocols, quality metrics, and best practice recommendations. A written personalized care plan for preventive services as well as general preventive health recommendations  were provided to patient.   Juliene JONETTA Bucks, RMA   06/25/2024   No follow-ups on file.  After Visit Summary: (In Person-Printed) AVS printed and given to the patient  Nurse Notes: None  "

## 2024-06-26 ENCOUNTER — Other Ambulatory Visit: Payer: Self-pay

## 2024-06-26 ENCOUNTER — Ambulatory Visit: Payer: Self-pay | Admitting: Family Medicine

## 2024-06-26 DIAGNOSIS — Z8679 Personal history of other diseases of the circulatory system: Secondary | ICD-10-CM

## 2024-06-26 NOTE — Progress Notes (Signed)
 Called patient and LVM for pt to call back for results.

## 2024-07-04 ENCOUNTER — Other Ambulatory Visit: Payer: Self-pay | Admitting: Family Medicine

## 2024-07-04 DIAGNOSIS — I10 Essential (primary) hypertension: Secondary | ICD-10-CM

## 2024-07-04 MED ORDER — POTASSIUM CHLORIDE CRYS ER 10 MEQ PO TBCR: 10.0000 meq | EXTENDED_RELEASE_TABLET | Freq: Every day | ORAL | 3 refills | Status: AC

## 2024-07-10 ENCOUNTER — Ambulatory Visit: Admitting: Vascular Surgery

## 2024-07-10 ENCOUNTER — Telehealth: Payer: Self-pay

## 2024-07-10 ENCOUNTER — Ambulatory Visit (HOSPITAL_COMMUNITY)

## 2024-07-10 NOTE — Telephone Encounter (Signed)
 Copied from CRM #[id number]. Topic: Clinical - Medication Question >> Jul 09, 2024  4:56 PM China J wrote: Reason for CRM: Patient is struggling with swallowing his potassium chloride  (KLOR-CON  M) 10 MEQ tablet since it is uncoated. He said that it get's stuck when trying to swallow.  He was wondering if Dr. Joyce could send the rest of his medication as coated tablets. He has 80 tablets left of what he is already taking. Please call the patient at [phone] for an update.

## 2024-07-10 NOTE — Telephone Encounter (Signed)
 Called Patient and he will try the applesauce and see if it helps to get pills down.

## 2024-07-24 ENCOUNTER — Ambulatory Visit (HOSPITAL_COMMUNITY)

## 2024-07-24 ENCOUNTER — Ambulatory Visit (HOSPITAL_COMMUNITY): Admitting: Vascular Surgery

## 2025-06-25 ENCOUNTER — Encounter: Admitting: Family Medicine
